# Patient Record
Sex: Female | Born: 1995 | Hispanic: Yes | Smoking: Never smoker
Health system: Southern US, Community
[De-identification: ages and names within clinical notes are randomized; demographics above are authoritative.]

## PROBLEM LIST (undated history)

## (undated) ENCOUNTER — Ambulatory Visit: Admission: EM | Payer: BC Managed Care – PPO

## (undated) DIAGNOSIS — O139 Gestational [pregnancy-induced] hypertension without significant proteinuria, unspecified trimester: Secondary | ICD-10-CM

## (undated) DIAGNOSIS — F419 Anxiety disorder, unspecified: Secondary | ICD-10-CM

## (undated) HISTORY — DX: Anxiety disorder, unspecified: F41.9

## (undated) HISTORY — DX: Gestational (pregnancy-induced) hypertension without significant proteinuria, unspecified trimester: O13.9

## (undated) HISTORY — PX: NO PAST SURGERIES: SHX2092

---
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
## 2014-06-24 IMAGING — US US OB DETAIL+14 WK - NRPT [facility]
1 series · 14 of 28 positions shown · non-contrast
Comparison: none

[Series 1: us ob detail+14 wk - nrpt [facility] · 0.30mm/px · 14 of 93 slices shown]
[im 4/93]
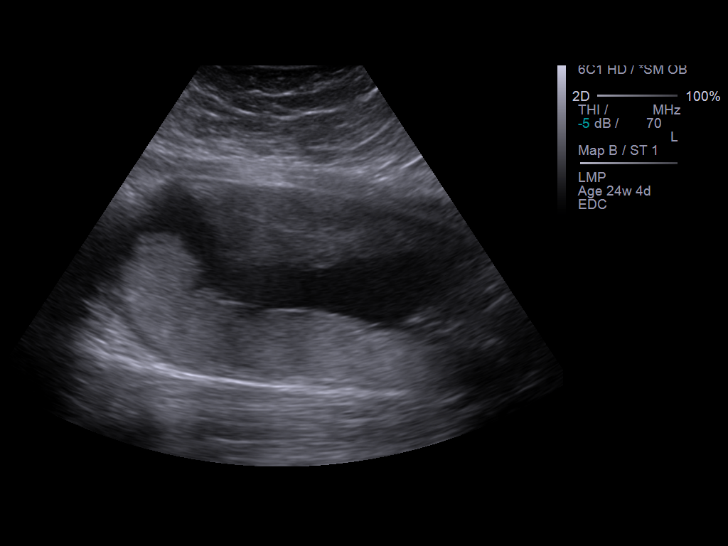
[im 11/93]
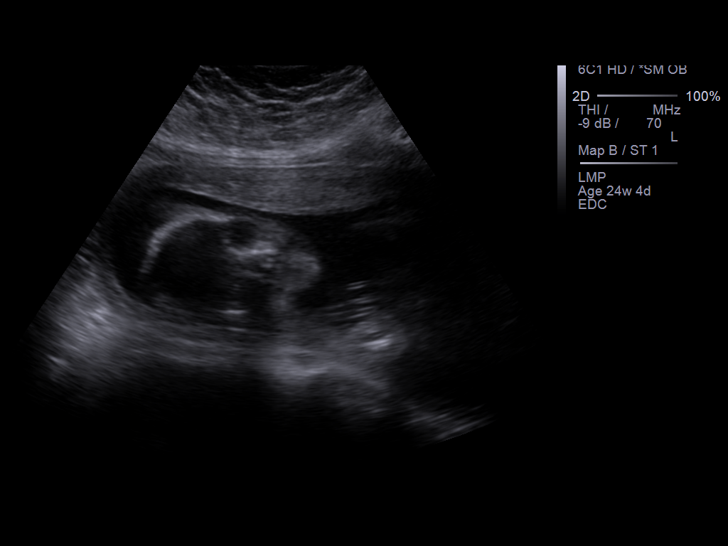
[im 18/93]
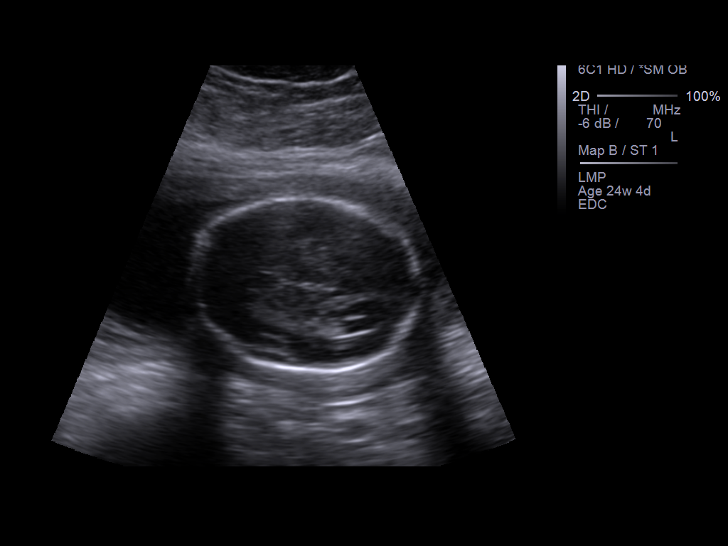
[im 24/93]
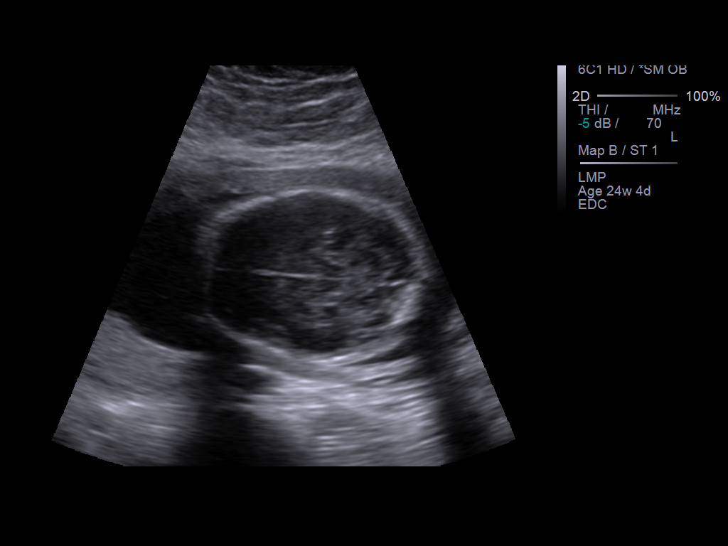
[im 31/93]
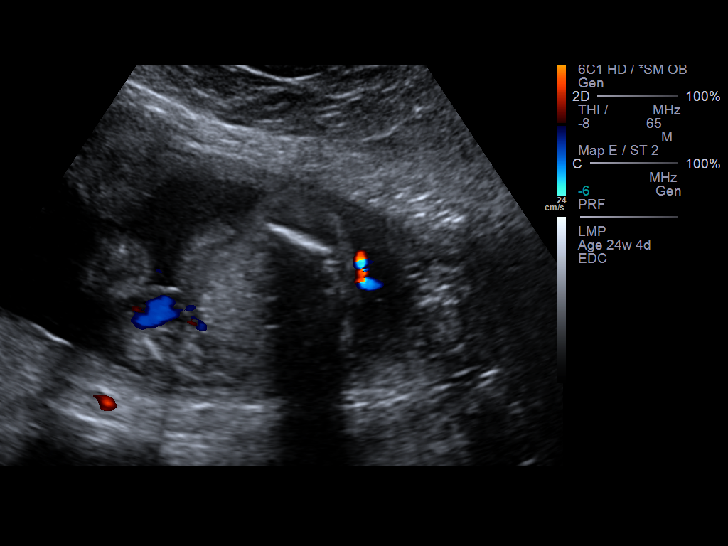
[im 38/93]
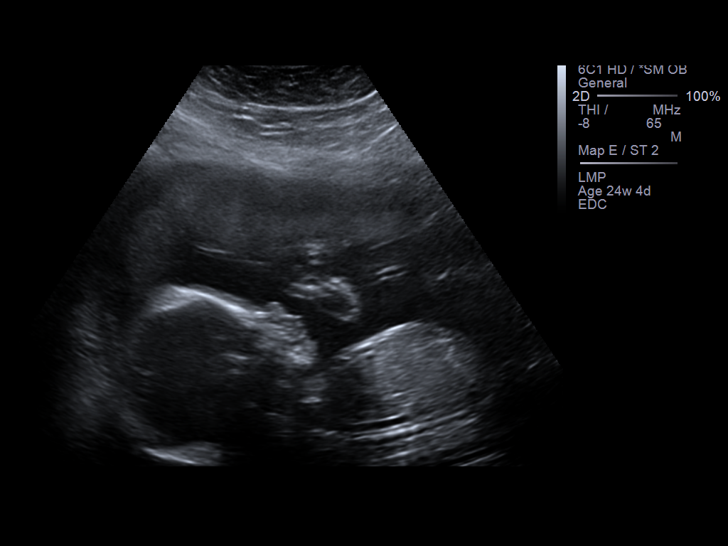
[im 45/93]
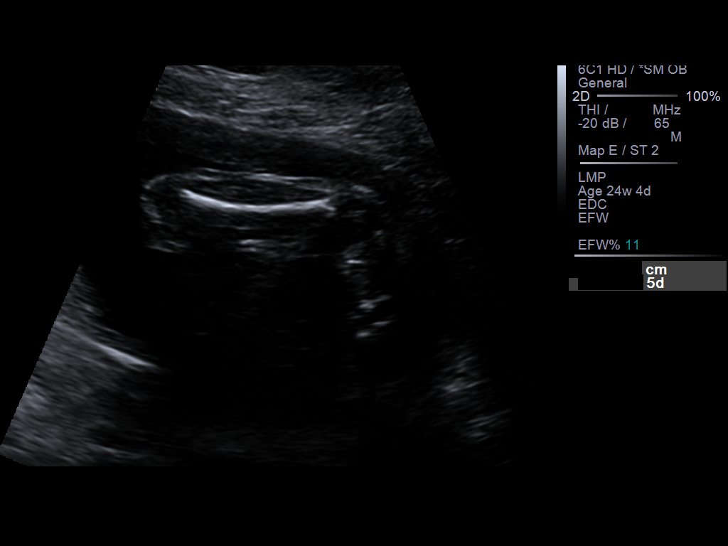
[im 52/93]
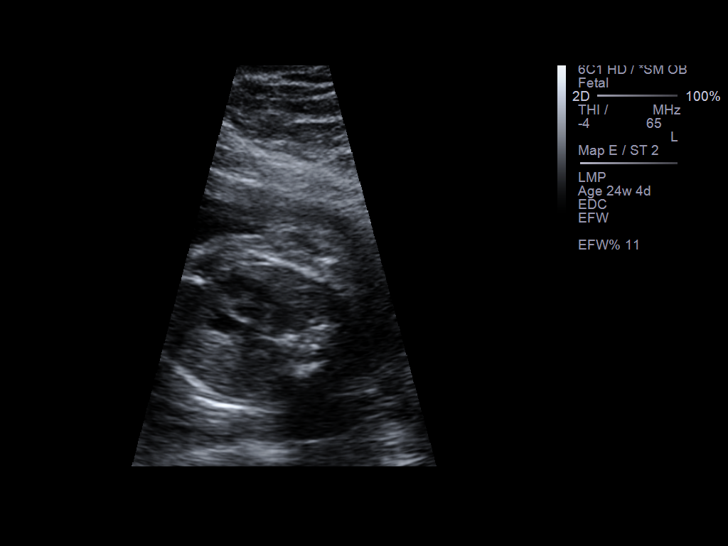
[im 58/93]
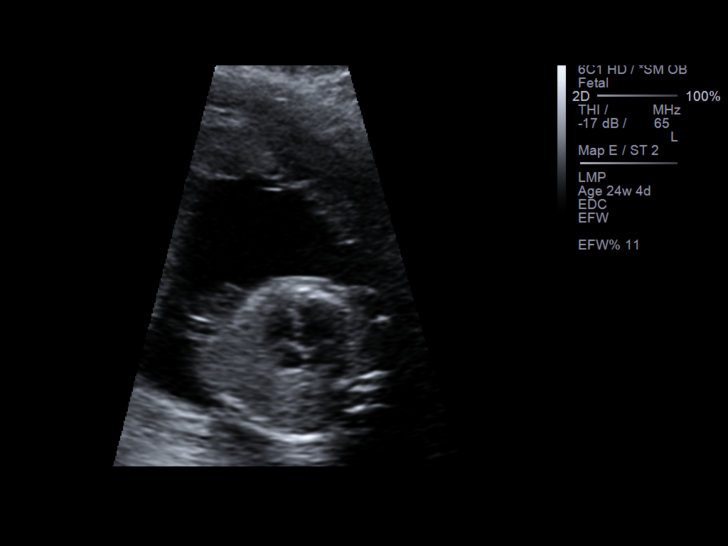
[im 65/93]
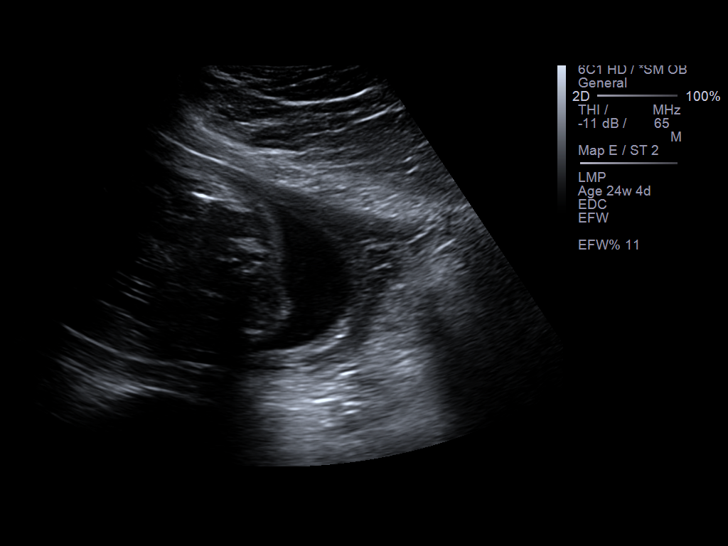
[im 72/93]
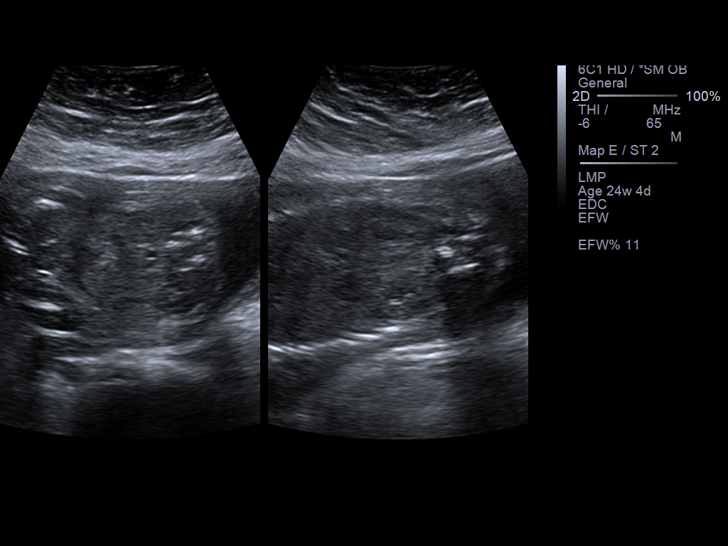
[im 79/93]
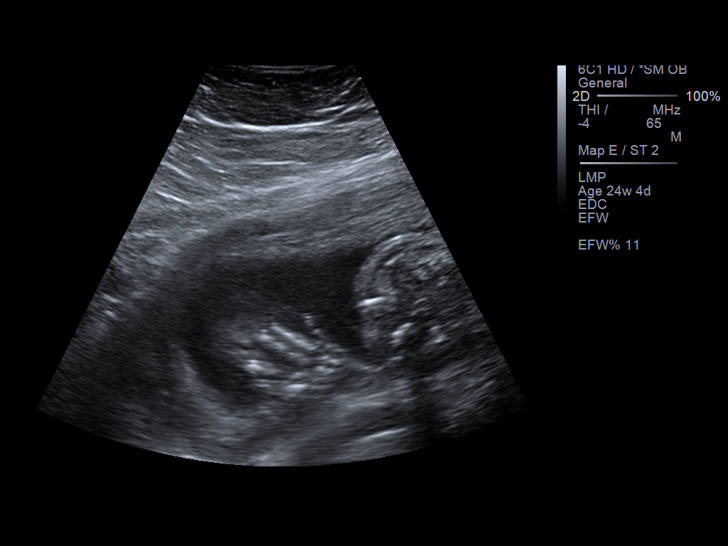
[im 86/93]
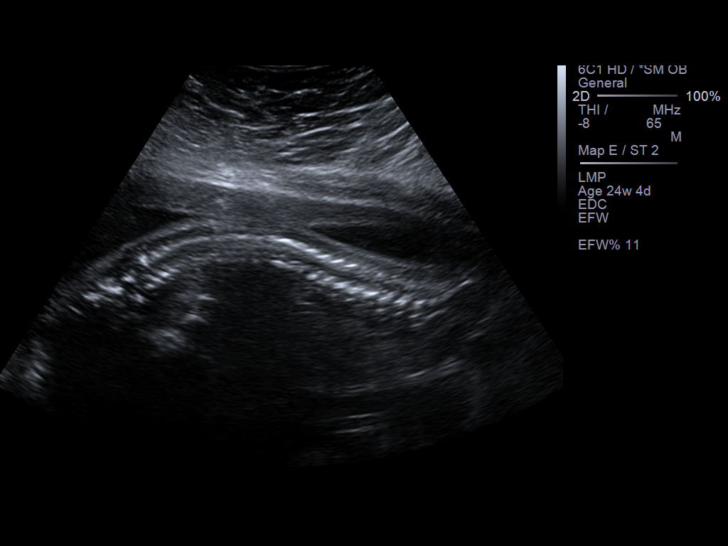
[im 93/93]
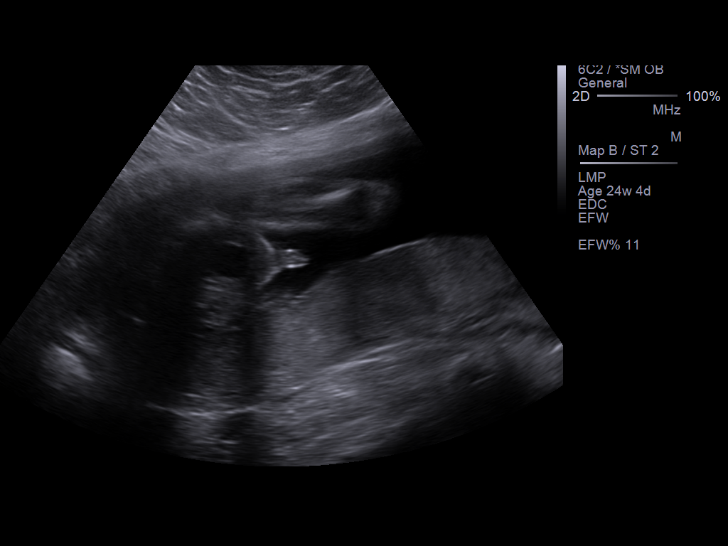

[14 of 28 positions shown; findings below may reference images not displayed]

IMAGES IMPORTED FROM THE SYNGO WORKFLOW SYSTEM
NO DICTATION FOR STUDY

## 2014-08-10 NOTE — H&P (Signed)
L&D Evaluation:  History:  HPI 19 year old G1 P0 with EDC=03/08/2013 by LMP=06/01/2012 (and a 18wk6d US -4days) sent from office at 40 1/7 weeks with oligohydraminos (AFI=3.86) and elevated BP 136/92. Baby active. Denies strong regular ctxs, LOF, headaches, visual changes, RUQ pain. Has had more swelling in the hands and feet. Noticed tinged mucus on toliet tissue today after cx exam.  [facility], and BMI=39 at start of pregnancy with a 70# weight gain. One hr GTTs 83 and 93. Growth scans q 4 weeks in the last trimester with the last EFW 7# on 11/14 (64th%). Nonstress tests have been reactive. LABS: O POS, RNI, Varicella Immune, and GBS positive. Received  last TDAP 07/27/2010 and flu vaccine 01/20/2013   Presents with oligohydraminos and elevated BP   Patient's Medical History Obesity   Patient's Surgical History none   Medications Pre Natal Vitamins   Allergies NKDA   Social History none   Family History Non-Contributory   ROS:  ROS see HPI   Exam:  Vital Signs 158/91, 146/76, 121/72    Urine Protein PRO/CR=2769 mgm   General no apparent distress   Mental Status clear   Chest clear   Heart normal sinus rhythm, no murmur/gallop/rubs   Abdomen gravid, non-tender   Estimated Fetal Weight Large for gestational age, 9#   Fetal Position vtx   Edema 1+   Reflexes 2+   Pelvic no external lesions, 1/50%/-1 to -2/mid/soft   Mebranes Intact   FHT normal rate with no decels, 130-135 with accels to 150s-Cat 1   Ucx irregular, mild   Skin dry   Other PIH labs WNL except for proteinuria.   Impression:  Impression IUP at 40.1 wks with oligo and preeclampsia without severe features.   Plan:  Plan EFM/NST, monitor contractions and for cervical change, monitor BP, antibiotics for GBBS prophylaxis, Discussed risks of continued observation and testing vs risks of induction. Aware of risks of hyperstimulation, FITL, C-section and failed  induction. as well as probably needing serial IOL with unripe cervix.  Patient verbalizes understanding and wishes to proceed with IOL. Cervidil inserted vaginally.   Electronic Signatures: Trinna BalloonGutierrez, Jaun Galluzzo L (CNM)  (Signed 08-Dec-14 16:32)  Authored: L&D Evaluation   Last Updated: 08-Dec-14 16:32 by Trinna BalloonGutierrez, Charles Niese L (CNM)

## 2019-05-04 ENCOUNTER — Encounter: Payer: Self-pay | Admitting: Emergency Medicine

## 2019-05-04 ENCOUNTER — Emergency Department
Admission: EM | Admit: 2019-05-04 | Discharge: 2019-05-04 | Disposition: A | Discharge status: Home / Self Care | Payer: 59 | Attending: Emergency Medicine | Admitting: Emergency Medicine

## 2019-05-04 ENCOUNTER — Other Ambulatory Visit: Payer: Self-pay

## 2019-05-04 ENCOUNTER — Emergency Department: Payer: 59

## 2019-05-04 DIAGNOSIS — Z20822 Contact with and (suspected) exposure to covid-19: Secondary | ICD-10-CM | POA: Insufficient documentation

## 2019-05-04 DIAGNOSIS — R197 Diarrhea, unspecified: Secondary | ICD-10-CM | POA: Insufficient documentation

## 2019-05-04 DIAGNOSIS — R112 Nausea with vomiting, unspecified: Secondary | ICD-10-CM | POA: Insufficient documentation

## 2019-05-04 DIAGNOSIS — R05 Cough: Secondary | ICD-10-CM | POA: Diagnosis not present

## 2019-05-04 DIAGNOSIS — R509 Fever, unspecified: Secondary | ICD-10-CM | POA: Insufficient documentation

## 2019-05-04 DIAGNOSIS — R0602 Shortness of breath: Secondary | ICD-10-CM | POA: Diagnosis present

## 2019-05-04 LAB — CBC
HCT: 43.1 % (ref 36.0–46.0)
Hemoglobin: 13.3 g/dL (ref 12.0–15.0)
MCH: 24.7 pg — ABNORMAL LOW (ref 26.0–34.0)
MCHC: 30.9 g/dL (ref 30.0–36.0)
MCV: 80 fL (ref 80.0–100.0)
Platelets: 286 10*3/uL (ref 150–400)
RBC: 5.39 MIL/uL — ABNORMAL HIGH (ref 3.87–5.11)
RDW: 14.6 % (ref 11.5–15.5)
WBC: 9.8 10*3/uL (ref 4.0–10.5)
nRBC: 0 % (ref 0.0–0.2)

## 2019-05-04 LAB — BASIC METABOLIC PANEL
Anion gap: 10 (ref 5–15)
BUN: 6 mg/dL (ref 6–20)
CO2: 27 mmol/L (ref 22–32)
Calcium: 9.4 mg/dL (ref 8.9–10.3)
Chloride: 103 mmol/L (ref 98–111)
Creatinine, Ser: 0.55 mg/dL (ref 0.44–1.00)
GFR calc Af Amer: >60 mL/min (ref >60)
GFR calc non Af Amer: >60 mL/min (ref >60)
Glucose, Bld: 131 mg/dL — ABNORMAL HIGH (ref 70–99)
Potassium: 3.9 mmol/L (ref 3.5–5.1)
Sodium: 140 mmol/L (ref 135–145)

## 2019-05-04 LAB — TROPONIN I (HIGH SENSITIVITY): Troponin I (High Sensitivity): 3 ng/L (ref <18)

## 2019-05-04 LAB — POCT PREGNANCY, URINE: Preg Test, Ur: NEGATIVE

## 2019-05-04 MED ORDER — ALBUTEROL SULFATE HFA 108 (90 BASE) MCG/ACT IN AERS: 2.0000 | INHALATION_SPRAY | Freq: Four times a day (QID) | RESPIRATORY_TRACT | 0 refills | Status: DC | PRN

## 2019-05-04 MED ORDER — ONDANSETRON 4 MG PO TBDP: 4.0000 mg | ORAL_TABLET | Freq: Three times a day (TID) | ORAL | 0 refills | Status: DC | PRN

## 2019-05-04 NOTE — ED Notes (Addendum)
Patient reports that out of nowhere on Friday morning her body felt hot and she felt short of breath, and she felt like she was going to pass out.  She felt better Friday afternoon, but the symptoms returned tonight around 1130 pm.  Covid test at urgent care on Friday was negative.  Pt does not appear to be in distress, SpO2 100 on room air.  Denies pain.

## 2019-05-04 NOTE — ED Triage Notes (Signed)
Patient with complaint of shortness of breath, hot flashes and vomiting that started on Friday. Patient states that she was seen at urgent care and tested for covid but that it was negative.

## 2019-05-04 NOTE — ED Provider Notes (Signed)
[facility] Emergency Department Provider Note   ____________________________________________   First MD Initiated Contact with Patient 05/04/19 0205     (approximate)  I have reviewed the triage vital signs and the nursing notes.   HISTORY  Chief Complaint Shortness of Breath and Emesis    HPI Ashley Austin is a 24 y.o. female with no significant past medical history who presents to the ED complaining of vomiting and shortness of breath.  Patient reports that she has been dealing with subjective fevers and chills for the past 3 days.  She describes occasional shortness of breath and was dealing with a cough yesterday, however she states she is not currently feeling short of breath and her cough is improved.  She has been feeling nauseous with a couple episodes of nonbilious and nonbloody vomiting, also endorses diarrhea.  She has not had any chest pain.  She denies any sick contacts, was tested for Covid earlier this week at an urgent care, where results were negative.        History reviewed. No pertinent past medical history.  There are no problems to display for this patient.   History reviewed. No pertinent surgical history.  Prior to Admission medications   Medication Sig Start Date End Date Taking? Authorizing Provider  albuterol (VENTOLIN HFA) 108 (90 Base) MCG/ACT inhaler Inhale 2 puffs into the lungs every 6 (six) hours as needed for wheezing or shortness of breath. 05/04/19   Chesley Noon, MD  ondansetron (ZOFRAN ODT) 4 MG disintegrating tablet Take 1 tablet (4 mg total) by mouth every 8 (eight) hours as needed for nausea or vomiting. 05/04/19   Chesley Noon, MD    Allergies Patient has no known allergies.  No family history on file.  Social History Social History   Tobacco Use  . Smoking status: Never Smoker  . Smokeless tobacco: Never Used  Substance Use Topics  . Alcohol use: Not on file  . Drug use: Not on file    Review  of Systems  Constitutional: Positive for fever/chills Eyes: No visual changes. ENT: No sore throat. Cardiovascular: Denies chest pain. Respiratory: Positive for cough and shortness of breath. Gastrointestinal: No abdominal pain.  Positive for nausea and vomiting.  Positive for diarrhea.  No constipation. Genitourinary: Negative for dysuria. Musculoskeletal: Negative for back pain. Skin: Negative for rash. Neurological: Negative for headaches, focal weakness or numbness.  ____________________________________________   PHYSICAL EXAM:  VITAL SIGNS: ED Triage Vitals  Enc Vitals Group     BP 05/04/19 0117 (!) 157/83     Pulse Rate 05/04/19 0117 97     Resp 05/04/19 0117 16     Temp 05/04/19 0117 98.3 F (36.8 C)     Temp Source 05/04/19 0117 Oral     SpO2 05/04/19 0117 100 %     Weight 05/04/19 0111 (!) 318 lb (144.2 kg)     Height 05/04/19 0111 5\' 1"  (1.549 m)     Head Circumference --      Peak Flow --      Pain Score 05/04/19 0111 0     Pain Loc --      Pain Edu? --      Excl. in [place]? --     Constitutional: Alert and oriented. Eyes: Conjunctivae are normal. Head: Atraumatic. Nose: No congestion/rhinnorhea. Mouth/Throat: Mucous membranes are moist. Neck: Normal ROM Cardiovascular: Normal rate, regular rhythm. Grossly normal heart sounds. Respiratory: Normal respiratory effort.  No retractions. Lungs CTAB. Gastrointestinal: Soft and  nontender. No distention. Genitourinary: deferred Musculoskeletal: No lower extremity tenderness nor edema. Neurologic:  Normal speech and language. No gross focal neurologic deficits are appreciated. Skin:  Skin is warm, dry and intact. No rash noted. Psychiatric: Mood and affect are normal. Speech and behavior are normal.  ____________________________________________   LABS (all labs ordered are listed, but only abnormal results are displayed)  Labs Reviewed  BASIC METABOLIC PANEL - Abnormal; Notable for the following components:       Result Value   Glucose, Bld 131 (*)    All other components within normal limits  CBC - Abnormal; Notable for the following components:   RBC 5.39 (*)    MCH 24.7 (*)    All other components within normal limits  NOVEL CORONAVIRUS, NAA (HOSP ORDER, SEND-OUT TO REF LAB; TAT 18-24 HRS)  POC URINE PREG, ED  POCT PREGNANCY, URINE  TROPONIN I (HIGH SENSITIVITY)   ____________________________________________  EKG  ED ECG REPORT I, Blake Divine, the attending physician, personally viewed and interpreted this ECG.   Date: 05/04/2019  EKG Time: 1:24  Rate: 93  Rhythm: normal sinus rhythm  Axis: Normal  Intervals:none  ST&T Change: None   PROCEDURES  Procedure(s) performed (including Critical Care):  Procedures   ____________________________________________   INITIAL IMPRESSION / ASSESSMENT AND PLAN / ED COURSE       24 year old female presents to the ED complaining of subjective fevers and chills, shortness of breath, cough, nausea, and vomiting over the past 3 days.  She does state that her cough is improved and she is not feeling short of breath currently.  She is not in any respiratory distress and is maintaining O2 sats on room air.  Chest x-ray is negative for acute process and EKG shows no evidence of arrhythmia or ischemia.  Symptoms appear most consistent with COVID-19 and given she has only had a rapid test to thus far, we will perform PCR testing.  Lab work is reassuring, pregnancy testing negative.  Doubt PE as she is PERC negative.  She is appropriate for discharge home with COVID-19 results pending, will prescribe Zofran and albuterol for symptomatic management.  Counseled patient to quarantine and to return to the ED for new worsening symptoms, patient agrees with plan.      ____________________________________________   FINAL CLINICAL IMPRESSION(S) / ED DIAGNOSES  Final diagnoses:  Suspected COVID-19 virus infection  Nausea vomiting and diarrhea   Shortness of breath     ED Discharge Orders         Ordered    ondansetron (ZOFRAN ODT) 4 MG disintegrating tablet  Every 8 hours PRN     05/04/19 0255    albuterol (VENTOLIN HFA) 108 (90 Base) MCG/ACT inhaler  Every 6 hours PRN    Note to Pharmacy: Please supply with spacer   05/04/19 0255           Note:  This document was prepared using Dragon voice recognition software and may include unintentional dictation errors.   Blake Divine, MD 05/04/19 [phone]

## 2019-05-05 LAB — NOVEL CORONAVIRUS, NAA (HOSP ORDER, SEND-OUT TO REF LAB; TAT 18-24 HRS): SARS-CoV-2, NAA: NOT DETECTED

## 2019-05-06 ENCOUNTER — Encounter: Payer: Self-pay | Admitting: Emergency Medicine

## 2019-05-06 ENCOUNTER — Other Ambulatory Visit: Payer: Self-pay

## 2019-05-06 DIAGNOSIS — R42 Dizziness and giddiness: Secondary | ICD-10-CM | POA: Diagnosis present

## 2019-05-06 DIAGNOSIS — R110 Nausea: Secondary | ICD-10-CM | POA: Diagnosis not present

## 2019-05-06 LAB — CBC WITH DIFFERENTIAL/PLATELET
Abs Immature Granulocytes: 0.04 10*3/uL (ref 0.00–0.07)
Basophils Absolute: 0 10*3/uL (ref 0.0–0.1)
Basophils Relative: 0 %
Eosinophils Absolute: 0.1 10*3/uL (ref 0.0–0.5)
Eosinophils Relative: 1 %
HCT: 43.7 % (ref 36.0–46.0)
Hemoglobin: 13.8 g/dL (ref 12.0–15.0)
Immature Granulocytes: 0 %
Lymphocytes Relative: 14 %
Lymphs Abs: 1.7 10*3/uL (ref 0.7–4.0)
MCH: 24.8 pg — ABNORMAL LOW (ref 26.0–34.0)
MCHC: 31.6 g/dL (ref 30.0–36.0)
MCV: 78.6 fL — ABNORMAL LOW (ref 80.0–100.0)
Monocytes Absolute: 0.6 10*3/uL (ref 0.1–1.0)
Monocytes Relative: 5 %
Neutro Abs: 9.2 10*3/uL — ABNORMAL HIGH (ref 1.7–7.7)
Neutrophils Relative %: 80 %
Platelets: 307 10*3/uL (ref 150–400)
RBC: 5.56 MIL/uL — ABNORMAL HIGH (ref 3.87–5.11)
RDW: 14.3 % (ref 11.5–15.5)
WBC: 11.6 10*3/uL — ABNORMAL HIGH (ref 4.0–10.5)
nRBC: 0 % (ref 0.0–0.2)

## 2019-05-06 LAB — URINALYSIS, COMPLETE (UACMP) WITH MICROSCOPIC
Bacteria, UA: NONE SEEN
Bilirubin Urine: NEGATIVE
Glucose, UA: NEGATIVE mg/dL
Hgb urine dipstick: NEGATIVE
Ketones, ur: NEGATIVE mg/dL
Leukocytes,Ua: NEGATIVE
Nitrite: NEGATIVE
Protein, ur: NEGATIVE mg/dL
Specific Gravity, Urine: 1.003 — ABNORMAL LOW (ref 1.005–1.030)
pH: 6 (ref 5.0–8.0)

## 2019-05-06 LAB — POCT PREGNANCY, URINE: Preg Test, Ur: NEGATIVE

## 2019-05-06 NOTE — ED Triage Notes (Signed)
Patient ambulatory to triage with steady gait, without difficulty or distress noted, mask in place; pt reports here Sunday but not feeling better; c/o persistent dizziness and nausea; st COVID -

## 2019-05-07 ENCOUNTER — Emergency Department
Admission: EM | Admit: 2019-05-07 | Discharge: 2019-05-07 | Disposition: A | Discharge status: Home / Self Care | Payer: 59 | Attending: Emergency Medicine | Admitting: Emergency Medicine

## 2019-05-07 DIAGNOSIS — R42 Dizziness and giddiness: Secondary | ICD-10-CM

## 2019-05-07 DIAGNOSIS — R110 Nausea: Secondary | ICD-10-CM

## 2019-05-07 LAB — COMPREHENSIVE METABOLIC PANEL
ALT: 38 U/L (ref 0–44)
AST: 25 U/L (ref 15–41)
Albumin: 4.2 g/dL (ref 3.5–5.0)
Alkaline Phosphatase: 67 U/L (ref 38–126)
Anion gap: 9 (ref 5–15)
BUN: 10 mg/dL (ref 6–20)
CO2: 27 mmol/L (ref 22–32)
Calcium: 9.5 mg/dL (ref 8.9–10.3)
Chloride: 102 mmol/L (ref 98–111)
Creatinine, Ser: 0.74 mg/dL (ref 0.44–1.00)
GFR calc Af Amer: >60 mL/min (ref >60)
GFR calc non Af Amer: >60 mL/min (ref >60)
Glucose, Bld: 112 mg/dL — ABNORMAL HIGH (ref 70–99)
Potassium: 3.7 mmol/L (ref 3.5–5.1)
Sodium: 138 mmol/L (ref 135–145)
Total Bilirubin: 0.6 mg/dL (ref 0.3–1.2)
Total Protein: 8 g/dL (ref 6.5–8.1)

## 2019-05-07 LAB — TROPONIN I (HIGH SENSITIVITY): Troponin I (High Sensitivity): <2 ng/L (ref <18)

## 2019-05-07 MED ORDER — ONDANSETRON 4 MG PO TBDP
4.0000 mg | ORAL_TABLET | Freq: Once | ORAL | Status: AC
  Administered 2019-05-07: 4 mg via ORAL
  Filled 2019-05-07: qty 1

## 2019-05-07 NOTE — ED Provider Notes (Signed)
[facility] Emergency Department Provider Note  ____________________________________________  Time seen: Approximately 12:55 AM  I have reviewed the triage vital signs and the nursing notes.   HISTORY  Chief Complaint Dizziness   HPI Ashley Austin is a 24 y.o. female no significant past medical history who presents for evaluation of nausea and dizziness.  Patient reports that 4 days ago she started feeling like she had a cold with congestion, subjective fever, chills, vomiting.  She was seen in the emergency department and had a negative Covid test.  She reports that her symptoms were improving.  She is no longer having any vomiting, her cough and congestion have resolved.  Today she went back to work and while at work she started feeling dizzy and was shivering, she could not get herself to feel warm.  She felt like she was going to pass out which prompted the visit to the emergency room.  No chest pain or shortness of breath, no diarrhea.  Patient does report decreased appetite and has been eating and drinking less than normal.  No abdominal pain.  No fever.  She feels back to normal at this time.   PMH None - reviewed  Prior to Admission medications   Medication Sig Start Date End Date Taking? Authorizing Provider  albuterol (VENTOLIN HFA) 108 (90 Base) MCG/ACT inhaler Inhale 2 puffs into the lungs every 6 (six) hours as needed for wheezing or shortness of breath. 05/04/19   Blake Divine, MD  ondansetron (ZOFRAN ODT) 4 MG disintegrating tablet Take 1 tablet (4 mg total) by mouth every 8 (eight) hours as needed for nausea or vomiting. 05/04/19   Blake Divine, MD    Allergies Patient has no known allergies.  No family history on file.  Social History Social History   Tobacco Use  . Smoking status: Never Smoker  . Smokeless tobacco: Never Used  Substance Use Topics  . Alcohol use: Not on file  . Drug use: Not on file    Review of  Systems  Constitutional: Negative for fever. + Lightheadedness Eyes: Negative for visual changes. ENT: Negative for sore throat. Neck: No neck pain  Cardiovascular: Negative for chest pain. Respiratory: Negative for shortness of breath. Gastrointestinal: Negative for abdominal pain, vomiting or diarrhea. + nausea Genitourinary: Negative for dysuria. Musculoskeletal: Negative for back pain. Skin: Negative for rash. Neurological: Negative for headaches, weakness or numbness. Psych: No SI or HI  ____________________________________________   PHYSICAL EXAM:  VITAL SIGNS: ED Triage Vitals  Enc Vitals Group     BP 05/06/19 2331 122/83     Pulse Rate 05/06/19 2331 92     Resp 05/06/19 2331 18     Temp 05/06/19 2331 98.8 F (37.1 C)     Temp Source 05/06/19 2331 Oral     SpO2 05/06/19 2331 100 %     Weight 05/06/19 2327 (!) 318 lb (144.2 kg)     Height 05/06/19 2327 5\' 1"  (1.549 m)     Head Circumference --      Peak Flow --      Pain Score 05/06/19 2326 0     Pain Loc --      Pain Edu? --      Excl. in [place]? --     Constitutional: Alert and oriented. Well appearing and in no apparent distress. HEENT:      Head: Normocephalic and atraumatic.         Eyes: Conjunctivae are normal. Sclera is non-icteric.  Mouth/Throat: Mucous membranes are moist.       Neck: Supple with no signs of meningismus. Cardiovascular: Regular rate and rhythm. No murmurs, gallops, or rubs. 2+ symmetrical distal pulses are present in all extremities. No JVD. Respiratory: Normal respiratory effort. Lungs are clear to auscultation bilaterally. No wheezes, crackles, or rhonchi.  Gastrointestinal: Soft, non tender, and non distended with positive bowel sounds. No rebound or guarding. Musculoskeletal: Nontender with normal range of motion in all extremities. No edema, cyanosis, or erythema of extremities. Neurologic: Normal speech and language. Face is symmetric. Moving all extremities. No gross focal  neurologic deficits are appreciated. Skin: Skin is warm, dry and intact. No rash noted. Psychiatric: Mood and affect are normal. Speech and behavior are normal.  ____________________________________________   LABS (all labs ordered are listed, but only abnormal results are displayed)  Labs Reviewed  CBC WITH DIFFERENTIAL/PLATELET - Abnormal; Notable for the following components:      Result Value   WBC 11.6 (*)    RBC 5.56 (*)    MCV 78.6 (*)    MCH 24.8 (*)    Neutro Abs 9.2 (*)    All other components within normal limits  COMPREHENSIVE METABOLIC PANEL - Abnormal; Notable for the following components:   Glucose, Bld 112 (*)    All other components within normal limits  URINALYSIS, COMPLETE (UACMP) WITH MICROSCOPIC - Abnormal; Notable for the following components:   Color, Urine STRAW (*)    APPearance CLEAR (*)    Specific Gravity, Urine 1.003 (*)    All other components within normal limits  POCT PREGNANCY, URINE  TROPONIN I (HIGH SENSITIVITY)   ____________________________________________  EKG  ED ECG REPORT I, Nita Sickle, the attending physician, personally viewed and interpreted this ECG.  Normal sinus rhythm, rate of 87, normal intervals, normal axis, no ST elevations or depressions.  Unchanged from prior. ____________________________________________  RADIOLOGY  none  ____________________________________________   PROCEDURES  Procedure(s) performed: None Procedures Critical Care performed:  None ____________________________________________   INITIAL IMPRESSION / ASSESSMENT AND PLAN / ED COURSE   24 y.o. female no significant past medical history who presents for evaluation of nausea, decreased appetite, and dizziness.  Patient is extremely well-appearing in no distress, she has normal vital signs, negative orthostatic vital signs, looks well-hydrated with normal work of breathing and normal sats, abdomen is soft with no tenderness.  Labs show no  signs of dehydration, anemia, electrolyte abnormalities, pregnancy, or UTI.  EKG and troponin showed no signs of ischemia or dysrhythmias.  Mildly elevated white count of 11.6 most likely due to a viral syndrome since patient has had a couple of days of viral symptoms. Seems to be improving per patient report.  Covid test done 2 days ago and negative.  Will provide with a note for work for another day or 2, Zofran for nausea, increase oral hydration follow-up with PCP.  Discussed my standard return precautions for chest pain, shortness of breath, syncope.       As part of my medical decision making, I reviewed the following data within the electronic MEDICAL RECORD NUMBER Nursing notes reviewed and incorporated, Labs reviewed , EKG interpreted , Old EKG reviewed, Old chart reviewed, Notes from prior ED visits and [place] Controlled Substance Database   Please note:  Patient was evaluated in Emergency Department today for the symptoms described in the history of present illness. Patient was evaluated in the context of the global COVID-19 pandemic, which necessitated consideration that the patient might be at  risk for infection with the SARS-CoV-2 virus that causes COVID-19. Institutional protocols and algorithms that pertain to the evaluation of patients at risk for COVID-19 are in a state of rapid change based on information released by regulatory bodies including the CDC and federal and state organizations. These policies and algorithms were followed during the patient's care in the ED.  Some ED evaluations and interventions may be delayed as a result of limited staffing during the pandemic.   ____________________________________________   FINAL CLINICAL IMPRESSION(S) / ED DIAGNOSES   Final diagnoses:  Lightheadedness  Nausea      NEW MEDICATIONS STARTED DURING THIS VISIT:  ED Discharge Orders    None       Note:  This document was prepared using Dragon voice recognition software and may  include unintentional dictation errors.    Don Perking, [place], MD 05/07/19 0100

## 2019-06-14 NOTE — Patient Instructions (Signed)
I value your feedback and entrusting us with your care. If you get a [facility] patient survey, I would appreciate you taking the time to let us know about your experience today. Thank you!  As of March 12, 2019, your lab results will be released to your MyChart immediately, before I even have a chance to see them. Please give me time to review them and contact you if there are any abnormalities. Thank you for your patience.  

## 2019-06-14 NOTE — Progress Notes (Signed)
PCP:  Patient, No Pcp Per   Chief Complaint  Patient presents with  . Gynecologic Exam    flu shot     HPI:      [name] is a 24 y.o. No obstetric history on file. who LMP was Patient's last menstrual period was 06/02/2019 (approximate)., presents today for her NP > 70yrs annual examination.  Her menses are regular every 28-30 days, lasting 7 days.  Dysmenorrhea none. She does not have intermenstrual bleeding.  Sex activity: single partner, contraception - condoms, declines other BC.  Last Pap: not recent; no hx of abn Hx of STDs: none  There is no FH of breast cancer. There is no FH of ovarian cancer. The patient does not do self-breast exams.  Tobacco use: The patient denies current or previous tobacco use. Alcohol use: none No drug use.  Exercise: moderately active  She does get adequate calcium and Vitamin D in her diet.  Has had 2 sinus infections since Feb. No hx of them in past. On augmentin twice. Had vision changes at one point, but now ears hurt and head feels full. Also with upper body aches in bilat arms, shoulders, chest, and neck. Sx since on abx.  Taking sudafed. Went to Urgent care.   Past Medical History:  Diagnosis Date  . Gestational hypertension     History reviewed. No pertinent surgical history.  Family History  Problem Relation Age of Onset  . Breast cancer Neg Hx   . Ovarian cancer Neg Hx     Social History   Socioeconomic History  . Marital status: Single    Spouse name: Not on file  . Number of children: Not on file  . Years of education: Not on file  . Highest education level: Not on file  Occupational History  . Not on file  Tobacco Use  . Smoking status: Never Smoker  . Smokeless tobacco: Never Used  Substance and Sexual Activity  . Alcohol use: Not Currently  . Drug use: Never  . Sexual activity: Yes    Birth control/protection: None  Other Topics Concern  . Not on file  Social History Narrative  . Not on file    Social Determinants of Health   Financial Resource Strain:   . Difficulty of Paying Living Expenses:   Food Insecurity:   . Worried About Charity fundraiser in the Last Year:   . Arboriculturist in the Last Year:   Transportation Needs:   . Film/video editor (Medical):   Marland Kitchen Lack of Transportation (Non-Medical):   Physical Activity:   . Days of Exercise per Week:   . Minutes of Exercise per Session:   Stress:   . Feeling of Stress :   Social Connections:   . Frequency of Communication with Friends and Family:   . Frequency of Social Gatherings with Friends and Family:   . Attends Religious Services:   . Active Member of Clubs or Organizations:   . Attends Archivist Meetings:   Marland Kitchen Marital Status:   Intimate Partner Violence:   . Fear of Current or Ex-Partner:   . Emotionally Abused:   Marland Kitchen Physically Abused:   . Sexually Abused:      Current Outpatient Medications:  .  albuterol (VENTOLIN HFA) 108 (90 Base) MCG/ACT inhaler, Inhale 2 puffs into the lungs every 6 (six) hours as needed for wheezing or shortness of breath., Disp: 8 g, Rfl: 0 .  amoxicillin-clavulanate (AUGMENTIN)  875-125 MG tablet, Take 1 tablet by mouth 2 (two) times daily., Disp: , Rfl:      ROS:  Review of Systems  Constitutional: Positive for fatigue. Negative for fever and unexpected weight change.  HENT: Positive for ear pain, rhinorrhea and sore throat.   Respiratory: Negative for cough, shortness of breath and wheezing.   Cardiovascular: Negative for chest pain, palpitations and leg swelling.  Gastrointestinal: Negative for blood in stool, constipation, diarrhea, nausea and vomiting.  Endocrine: Negative for cold intolerance, heat intolerance and polyuria.  Genitourinary: Negative for dyspareunia, dysuria, flank pain, frequency, genital sores, hematuria, menstrual problem, pelvic pain, urgency, vaginal bleeding, vaginal discharge and vaginal pain.  Musculoskeletal: Positive for  myalgias. Negative for back pain and joint swelling.  Skin: Negative for rash.  Neurological: Negative for dizziness, syncope, light-headedness, numbness and headaches.  Hematological: Negative for adenopathy.  Psychiatric/Behavioral: Negative for agitation, confusion, sleep disturbance and suicidal ideas. The patient is not nervous/anxious.   BREAST: No symptoms   Objective: BP 130/90   Ht 5\' 1"  (1.549 m)   Wt (!) 313 lb (142 kg)   LMP 06/02/2019 (Approximate)   BMI 59.14 kg/m    Physical Exam Constitutional:      Appearance: She is well-developed.  Genitourinary:     Vulva, vagina, cervix, uterus, right adnexa and left adnexa normal.     No vulval lesion or tenderness noted.     No vaginal discharge, erythema or tenderness.     No cervical polyp.     Uterus is not enlarged or tender.     No right or left adnexal mass present.     Right adnexa not tender.     Left adnexa not tender.  HENT:     Nose:     Right Sinus: No maxillary sinus tenderness or frontal sinus tenderness.     Left Sinus: No maxillary sinus tenderness or frontal sinus tenderness.  Neck:     Thyroid: No thyromegaly.  Cardiovascular:     Rate and Rhythm: Normal rate and regular rhythm.     Heart sounds: Normal heart sounds. No murmur.  Pulmonary:     Effort: Pulmonary effort is normal.     Breath sounds: Normal breath sounds.  Chest:     Breasts:        Right: No mass, nipple discharge, skin change or tenderness.        Left: No mass, nipple discharge, skin change or tenderness.  Abdominal:     Palpations: Abdomen is soft.     Tenderness: There is no abdominal tenderness. There is no guarding.  Musculoskeletal:        General: Normal range of motion.     Cervical back: Normal range of motion.  Neurological:     General: No focal deficit present.     Mental Status: She is alert and oriented to person, place, and time.     Cranial Nerves: No cranial nerve deficit.  Skin:    General: Skin is warm  and dry.  Psychiatric:        Mood and Affect: Mood normal.        Behavior: Behavior normal.        Thought Content: Thought content normal.        Judgment: Judgment normal.  Vitals reviewed.     Assessment/Plan: Encounter for annual routine gynecological examination  Cervical cancer screening - Plan: Cytology - PAP  Screening for STD (sexually transmitted disease) - Plan: Cytology - PAP  Myalgia--upper body. Complete abx and f/u with PCP if sx persist.  Needs flu shot - Plan: Flu Vaccine QUAD 36+ mos IM (Fluarix, Quad PF)            GYN counsel adequate intake of calcium and vitamin D, diet and exercise     F/U  Return in about 1 year (around 06/15/2020).  Lesta Limbert B. Devesh Monforte, PA-C 06/16/2019 8:45 AM

## 2019-06-16 ENCOUNTER — Ambulatory Visit (INDEPENDENT_AMBULATORY_CARE_PROVIDER_SITE_OTHER): Payer: 59 | Admitting: Obstetrics and Gynecology

## 2019-06-16 ENCOUNTER — Other Ambulatory Visit (HOSPITAL_COMMUNITY)
Admission: RE | Admit: 2019-06-16 | Discharge: 2019-06-16 | Disposition: A | Discharge status: Home / Self Care | Payer: 59 | Source: Ambulatory Visit | Attending: Obstetrics and Gynecology | Admitting: Obstetrics and Gynecology

## 2019-06-16 ENCOUNTER — Other Ambulatory Visit: Payer: Self-pay

## 2019-06-16 ENCOUNTER — Encounter: Payer: Self-pay | Admitting: Obstetrics and Gynecology

## 2019-06-16 VITALS — BP 130/90 | Ht 61.0 in | Wt 313.0 lb

## 2019-06-16 DIAGNOSIS — Z23 Encounter for immunization: Secondary | ICD-10-CM

## 2019-06-16 DIAGNOSIS — Z124 Encounter for screening for malignant neoplasm of cervix: Secondary | ICD-10-CM | POA: Diagnosis present

## 2019-06-16 DIAGNOSIS — Z113 Encounter for screening for infections with a predominantly sexual mode of transmission: Secondary | ICD-10-CM | POA: Insufficient documentation

## 2019-06-16 DIAGNOSIS — M791 Myalgia, unspecified site: Secondary | ICD-10-CM | POA: Diagnosis not present

## 2019-06-16 DIAGNOSIS — Z01419 Encounter for gynecological examination (general) (routine) without abnormal findings: Secondary | ICD-10-CM | POA: Diagnosis not present

## 2019-06-18 LAB — CYTOLOGY - PAP
Chlamydia: NEGATIVE
Comment: NEGATIVE
Comment: NORMAL
Diagnosis: NEGATIVE
Neisseria Gonorrhea: NEGATIVE

## 2019-11-17 ENCOUNTER — Telehealth: Payer: Self-pay

## 2019-11-17 NOTE — Telephone Encounter (Signed)
Pt calling; is preg; is it okay to take antidepressant?  [phone]  LMTC to go over medications.

## 2019-11-17 NOTE — Telephone Encounter (Signed)
Pt states she is not taking the alprazolam but is on paroxetine HCL 10mg .  Per ABC; stop taking it, call provider for preg friendly med; pt states that provider adv her to call ; adv pt it will be discussed at her NOB visit but to stop it for now.

## 2019-11-17 NOTE — Telephone Encounter (Signed)
Per [facility] we would rather she not take it; if she has taken it don't take anymore.

## 2019-12-15 ENCOUNTER — Other Ambulatory Visit (HOSPITAL_COMMUNITY)
Admission: RE | Admit: 2019-12-15 | Discharge: 2019-12-15 | Disposition: A | Discharge status: Home / Self Care | Payer: 59 | Source: Ambulatory Visit | Attending: Obstetrics and Gynecology | Admitting: Obstetrics and Gynecology

## 2019-12-15 ENCOUNTER — Encounter: Payer: Self-pay | Admitting: Obstetrics and Gynecology

## 2019-12-15 ENCOUNTER — Ambulatory Visit (INDEPENDENT_AMBULATORY_CARE_PROVIDER_SITE_OTHER): Payer: 59

## 2019-12-15 ENCOUNTER — Ambulatory Visit (INDEPENDENT_AMBULATORY_CARE_PROVIDER_SITE_OTHER): Payer: 59 | Admitting: Obstetrics and Gynecology

## 2019-12-15 ENCOUNTER — Other Ambulatory Visit: Payer: Self-pay | Admitting: Obstetrics and Gynecology

## 2019-12-15 ENCOUNTER — Other Ambulatory Visit: Payer: Self-pay

## 2019-12-15 VITALS — BP 108/70 | Ht 61.0 in | Wt 267.8 lb

## 2019-12-15 DIAGNOSIS — Z3A09 9 weeks gestation of pregnancy: Secondary | ICD-10-CM | POA: Insufficient documentation

## 2019-12-15 DIAGNOSIS — O0990 Supervision of high risk pregnancy, unspecified, unspecified trimester: Secondary | ICD-10-CM | POA: Diagnosis not present

## 2019-12-15 DIAGNOSIS — O99210 Obesity complicating pregnancy, unspecified trimester: Secondary | ICD-10-CM | POA: Insufficient documentation

## 2019-12-15 LAB — POCT URINALYSIS DIPSTICK OB
Glucose, UA: NEGATIVE
POC,PROTEIN,UA: NEGATIVE

## 2019-12-15 NOTE — Progress Notes (Signed)
12/15/2019   Chief Complaint: Missed period  Transfer of Care Patient: no  History of Present Illness: Ashley Austin is a 24 y.o. G3P1001 [redacted]w[redacted]d based on Patient's last menstrual period was 10/10/2019. with an Estimated Date of Delivery: 07/16/20, with the above CC.   Her periods were: regular periods every 30 days She was using no method when she conceived.  She has Positive signs or symptoms of nausea/vomiting of pregnancy. She has Negative signs or symptoms of miscarriage or preterm labor She was taking different medications around the time she conceived/early pregnancy.- Discontinued her antidepressant Since her LMP, she has not used alcohol Since her LMP, she has not used tobacco products Since her LMP, she has not used illegal drugs.    Current or past history of domestic violence. no  Infection History:  1. Since her LMP, she has not had a viral illness.  2. She admits to close contact with children on a regular basis     3. She denies a history of chicken pox. She reports vaccination for chicken pox in the past. 4. Patient or partner has history of genital herpes  no 5. History of STI (GC, CT, HPV, syphilis, HIV)  no   6.  She does not live with someone with TB or TB exposed. 7. History of recent travel :    8. She identifies Negative Zika risk factors for her and her partner 25. There are cats in the home in the home.  She understands that while pregnant she should not change cat litter.   Genetic Screening Questions: (Includes patient, baby's father, or anyone in either family)   1. Patient's age >/= 52 at [facility]  no 2. Thalassemia ([place], [place], Mediterranean, or Asian background): MCV<80  no 3. Neural tube defect (meningomyelocele, spina bifida, anencephaly)  no 4. Congenital heart defect  no  5. Down syndrome  no 6. Tay-Sachs (Jewish, [place]  no 7. Canavan's Disease  no 8. Sickle cell disease or trait (African)  no  9. Hemophilia or other blood disorders  no   10. Muscular dystrophy  no  11. Cystic fibrosis  no  12. Huntington's Chorea  no  13. Mental retardation/autism  no 14. Other inherited genetic or chromosomal disorder  no 15. Maternal metabolic disorder (DM, PKU, etc)  no 16. Patient or FOB with a child with a birth defect not listed above no  16a. Patient or FOB with a birth defect themselves no 17. Recurrent pregnancy loss, or stillbirth  no  18. Any medications since LMP other than prenatal vitamins (include vitamins, supplements, OTC meds, drugs, alcohol)  no 19. Any other genetic/environmental exposure to discuss  no  ROS:  Review of Systems  Constitutional: Negative for chills, fever, malaise/fatigue and weight loss.  HENT: Negative for congestion, hearing loss and sinus pain.   Eyes: Negative for blurred vision and double vision.  Respiratory: Negative for cough, sputum production, shortness of breath and wheezing.   Cardiovascular: Negative for chest pain, palpitations, orthopnea and leg swelling.  Gastrointestinal: Positive for nausea and vomiting. Negative for abdominal pain, constipation and diarrhea.  Genitourinary: Negative for dysuria, flank pain, frequency, hematuria and urgency.  Musculoskeletal: Negative for back pain, falls and joint pain.  Skin: Negative for itching and rash.  Neurological: Negative for dizziness and headaches.  Psychiatric/Behavioral: Negative for depression, substance abuse and suicidal ideas. The patient is not nervous/anxious.     OBGYN History: As per HPI. OB History  Gravida Para Term Preterm AB Living  3 1 1     1   SAB TAB Ectopic Multiple Live Births          1    # Outcome Date GA Lbr Len/2nd Weight Sex Delivery Anes PTL Lv  3 Current           2 Term 03/11/13 [redacted]w[redacted]d  6 lb 4 oz (2.835 kg) M Vag-Spont     1 Gravida             Any issues with any prior pregnancies: may of had gestational hypertension, she is unsure Any prior children are healthy, doing well, without any problems  or issues: yes Last pap smear 2021 NIL History of STIs: No   Past Medical History: Past Medical History:  Diagnosis Date  . Gestational hypertension     Past Surgical History: No past surgical history on file.  Family History:  Family History  Problem Relation Age of Onset  . Breast cancer Neg Hx   . Ovarian cancer Neg Hx    She denies any female cancers, bleeding or blood clotting disorders.   Social History:  Social History   Socioeconomic History  . Marital status: Single    Spouse name: Not on file  . Number of children: Not on file  . Years of education: Not on file  . Highest education level: Not on file  Occupational History  . Not on file  Tobacco Use  . Smoking status: Never Smoker  . Smokeless tobacco: Never Used  Vaping Use  . Vaping Use: Never used  Substance and Sexual Activity  . Alcohol use: Not Currently  . Drug use: Never  . Sexual activity: Yes    Birth control/protection: None  Other Topics Concern  . Not on file  Social History Narrative  . Not on file   Social Determinants of Health   Financial Resource Strain:   . Difficulty of Paying Living Expenses: Not on file  Food Insecurity:   . Worried About 2022 in the Last Year: Not on file  . Ran Out of Food in the Last Year: Not on file  Transportation Needs:   . Lack of Transportation (Medical): Not on file  . Lack of Transportation (Non-Medical): Not on file  Physical Activity:   . Days of Exercise per Week: Not on file  . Minutes of Exercise per Session: Not on file  Stress:   . Feeling of Stress : Not on file  Social Connections:   . Frequency of Communication with Friends and Family: Not on file  . Frequency of Social Gatherings with Friends and Family: Not on file  . Attends Religious Services: Not on file  . Active Member of Clubs or Organizations: Not on file  . Attends Programme researcher, broadcasting/film/video Meetings: Not on file  . Marital Status: Not on file  Intimate Partner  Violence:   . Fear of Current or Ex-Partner: Not on file  . Emotionally Abused: Not on file  . Physically Abused: Not on file  . Sexually Abused: Not on file    Allergy: No Known Allergies  Current Outpatient Medications: No current outpatient medications on file.   Physical Exam: Physical Exam Vitals and nursing note reviewed.  HENT:     Head: Normocephalic and atraumatic.  Eyes:     Pupils: Pupils are equal, round, and reactive to light.  Neck:     Thyroid: No thyromegaly.  Cardiovascular:     Rate and Rhythm: Normal rate and  regular rhythm.  Pulmonary:     Effort: Pulmonary effort is normal.  Abdominal:     General: Bowel sounds are normal. There is no distension.     Palpations: Abdomen is soft.     Tenderness: There is no abdominal tenderness. There is no guarding or rebound.  Musculoskeletal:        General: Normal range of motion.     Cervical back: Normal range of motion and neck supple.  Skin:    General: Skin is warm and dry.  Neurological:     Mental Status: She is alert and oriented to person, place, and time.  Psychiatric:        Mood and Affect: Affect normal.        Judgment: Judgment normal.     Do you snore loudly? ( louder than talking or loud enough to be heard through closed doors?) NO Do you often feel tired, fatigued, or sleepy during daytime? NO Has anyone observed you stop breathing during sleep? NO Do you have or are you being treated for high blood pressure? NO  BMI> 35 YES Age> 50 NO Neck circumference > 40 cm 40 CM- yes Female gender? NO  ** if yes to > 3 questions high risk of obstructive sleep apnea ** if yes to <3 questions+ low risk for obstructive sleep apnea  Screen negative   Assessment: Ms. Coventry is a 24 y.o. G3P1001 [redacted]w[redacted]d based on Patient's last menstrual period was 10/10/2019. with an Estimated Date of Delivery: 07/16/20,  for prenatal care.  Plan:  1) Avoid alcoholic beverages. 2) Patient encouraged not to  smoke.  3) Discontinue the use of all non-medicinal drugs and chemicals.  4) Take prenatal vitamins daily.  5) Seatbelt use advised 6) Nutrition, food safety (fish, cheese advisories, and high nitrite foods) and exercise discussed. 7) Hospital and practice style delivering at [facility] discussed  8) Patient is asked about travel to areas at risk for the Zika virus, and counseled to avoid travel and exposure to mosquitoes or sexual partners who may have themselves been exposed to the virus. Testing is discussed, and will be ordered as appropriate.  9) [facility]birth classes at [facility] advised 10) Genetic Screening, such as with 1st Trimester Screening, cell free fetal DNA, AFP testing, and Ultrasound, as well as with amniocentesis and CVS as appropriate, is discussed with patient. She plans to have genetic testing this pregnancy.  Will return for early 1 GTT Reviewed plan to manage nausea in pregnancy Discussed weight gain goals, referral to nutrition, referral to anesthesia Dating [place] today.  Encouraged COVID vaccination.   Problem list reviewed and updated.  I discussed the assessment and treatment plan with the patient. The patient was provided an opportunity to ask questions and all were answered. The patient agreed with the plan and demonstrated an understanding of the instructions.  Adelene Idler MD [facility], [facility] 12/15/2019 8:54 AM

## 2019-12-15 NOTE — Patient Instructions (Addendum)
Recommended Weight Gain in Pregnancy     BMI    Underweight  Less than 18.5 28-40 lbs Normal weight  18.5-24.9  25-35 lbs Overweight  25-29.9  15-25 lbs Obese   30 and greater  11-20 lbs    Initial steps to help :   B6 (pyridoxine) 25 mg,  3-4 times a day- 200 mg a day total Unisom (doxylamine) 25 mg at bedtime **B6 and Unisom are available as a combination prescription medications called diclegis and bonjesta  B1 (thiamin)  50-100 mg 1-2 a day-  100 mg a day total  Continue prenatal vitamin with iron and thiamin. If it is not tolerated switch to 1 mg of folic acid.  Can add medication for gastric reflux if needed.  Subsequent steps to be added to B1, B6, and Unisom:  1. Antihistamine (one of the following medications) Dramamine      25-50 mg every 4-6 hours Benadryl      25-50 mg every 4-6 hours Meclizine      25 mg every 6 hours  2. Dopamine Antagonist (one of the following medications) Metoclopramide  (Reglan)  5-10 mg every 6-8 hours         PO Promethazine   (Phenergan)   12.5-25 mg every 4-6 hours      PO or rectal Prochlorperazine  (Compazine)  5-10 mg every 6-8 hours      BID rectally   Subsequent steps if there has still not been improvement in symptoms:  3. Daily stool softner:  Colace 100 mg twice a day  4. Ondansetron  (Zofran)   4-8 mg every 6-8 hours      Hyperemesis Gravidarum Hyperemesis gravidarum is a severe form of nausea and vomiting that happens during pregnancy. Hyperemesis is worse than morning sickness. It may cause you to have nausea or vomiting all day for many days. It may keep you from eating and drinking enough food and liquids, which can lead to dehydration, malnutrition, and weight loss. Hyperemesis usually occurs during the first half (the first 20 weeks) of pregnancy. It often goes away once a woman is in her second half of pregnancy. However, sometimes hyperemesis continues through an entire pregnancy. What are the causes? The cause of  this condition is not known. It may be related to changes in chemicals (hormones) in the body during pregnancy, such as the high level of pregnancy hormone (human chorionic gonadotropin) or the increase in the female sex hormone (estrogen). What are the signs or symptoms? Symptoms of this condition include:  Nausea that does not go away.  Vomiting that does not allow you to keep any food down.  Weight loss.  Body fluid loss (dehydration).  Having no desire to eat, or not liking food that you have previously enjoyed. How is this diagnosed? This condition may be diagnosed based on:  A physical exam.  Your medical history.  Your symptoms.  Blood tests.  Urine tests. How is this treated? This condition is managed by controlling symptoms. This may include:  Following an eating plan. This can help lessen nausea and vomiting.  Taking prescription medicines. An eating plan and medicines are often used together to help control symptoms. If medicines do not help relieve nausea and vomiting, you may need to receive fluids through an IV at the hospital. Follow these instructions at home: Eating and drinking   Avoid the following: ? Drinking fluids with meals. Try not to drink anything during the 30 minutes before and after  your meals. ? Drinking more than 1 cup of fluid at a time. ? Eating foods that trigger your symptoms. These may include spicy foods, coffee, high-fat foods, very sweet foods, and acidic foods. ? Skipping meals. Nausea can be more intense on an empty stomach. If you cannot tolerate food, do not force it. Try sucking on ice chips or other frozen items and make up for missed calories later. ? Lying down within 2 hours after eating. ? Being exposed to environmental triggers. These may include food smells, smoky rooms, closed spaces, rooms with strong smells, warm or humid places, overly loud and noisy rooms, and rooms with motion or flickering lights. Try eating meals in  a well-ventilated area that is free of strong smells. ? Quick and sudden changes in your movement. ? Taking iron pills and multivitamins that contain iron. If you take prescription iron pills, do not stop taking them unless your health care provider approves. ? Preparing food. The smell of food can spoil your appetite or trigger nausea.  To help relieve your symptoms: ? Listen to your body. Everyone is different and has different preferences. Find what works best for you. ? Eat and drink slowly. ? Eat 5-6 small meals daily instead of 3 large meals. Eating small meals and snacks can help you avoid an empty stomach. ? In the morning, before getting out of bed, eat a couple of crackers to avoid moving around on an empty stomach. ? Try eating starchy foods as these are usually tolerated well. Examples include cereal, toast, bread, potatoes, pasta, rice, and pretzels. ? Include at least 1 serving of protein with your meals and snacks. Protein options include lean meats, poultry, seafood, beans, nuts, nut butters, eggs, cheese, and yogurt. ? Try eating a protein-rich snack before bed. Examples of a protein-rick snack include cheese and crackers or a peanut butter sandwich made with 1 slice of whole-wheat bread and 1 tsp (5 g) of peanut butter. ? Eat or suck on things that have ginger in them. It may help relieve nausea. Add  tsp ground ginger to hot tea or choose ginger tea. ? Try drinking 100% fruit juice or an electrolyte drink. An electrolyte drink contains sodium, potassium, and chloride. ? Drink fluids that are cold, clear, and carbonated or sour. Examples include lemonade, ginger ale, lemon-lime soda, ice water, and sparkling water. ? Brush your teeth or use a mouth rinse after meals. ? Talk with your health care provider about starting a supplement of vitamin B6. General instructions  Take over-the-counter and prescription medicines only as told by your health care provider.  Follow  instructions from your health care provider about eating or drinking restrictions.  Continue to take your prenatal vitamins as told by your health care provider. If you are having trouble taking your prenatal vitamins, talk with your health care provider about different options.  Keep all follow-up and pre-birth (prenatal) visits as told by your health care provider. This is important. Contact a health care provider if:  You have pain in your abdomen.  You have a severe headache.  You have vision problems.  You are losing weight.  You feel weak or dizzy. Get help right away if:  You cannot drink fluids without vomiting.  You vomit blood.  You have constant nausea and vomiting.  You are very weak.  You faint.  You have a fever and your symptoms suddenly get worse. Summary  Hyperemesis gravidarum is a severe form of nausea and vomiting that  happens during pregnancy.  Making some changes to your eating habits may help relieve nausea and vomiting.  This condition may be managed with medicine.  If medicines do not help relieve nausea and vomiting, you may need to receive fluids through an IV at the hospital. This information is not intended to replace advice given to you by your health care provider. Make sure you discuss any questions you have with your health care provider. Document Revised: 04/08/2017 Document Reviewed: 11/16/2015 Elsevier Patient Education  2020 [employer].     First Trimester of Pregnancy The first trimester of pregnancy is from week 1 until the end of week 13 (months 1 through 3). A week after a sperm fertilizes an egg, the egg will implant on the wall of the uterus. This embryo will begin to develop into a baby. Genes from you and your partner will form the baby. The female genes will determine whether the baby will be a boy or a girl. At 6-8 weeks, the eyes and face will be formed, and the heartbeat can be seen on ultrasound. At the end of 12 weeks,  all the baby's organs will be formed. Now that you are pregnant, you will want to do everything you can to have a healthy baby. Two of the most important things are to get good prenatal care and to follow your health care provider's instructions. Prenatal care is all the medical care you receive before the baby's birth. This care will help prevent, find, and treat any problems during the pregnancy and childbirth. Body changes during your first trimester Your body goes through many changes during pregnancy. The changes vary from woman to woman.  You may gain or lose a couple of pounds at first.  You may feel sick to your stomach (nauseous) and you may throw up (vomit). If the vomiting is uncontrollable, call your health care provider.  You may tire easily.  You may develop headaches that can be relieved by medicines. All medicines should be approved by your health care provider.  You may urinate more often. Painful urination may mean you have a bladder infection.  You may develop heartburn as a result of your pregnancy.  You may develop constipation because certain hormones are causing the muscles that push stool through your intestines to slow down.  You may develop hemorrhoids or swollen veins (varicose veins).  Your breasts may begin to grow larger and become tender. Your nipples may stick out more, and the tissue that surrounds them (areola) may become darker.  Your gums may bleed and may be sensitive to brushing and flossing.  Dark spots or blotches (chloasma, mask of pregnancy) may develop on your face. This will likely fade after the baby is born.  Your menstrual periods will stop.  You may have a loss of appetite.  You may develop cravings for certain kinds of food.  You may have changes in your emotions from day to day, such as being excited to be pregnant or being concerned that something may go wrong with the pregnancy and baby.  You may have more vivid and strange  dreams.  You may have changes in your hair. These can include thickening of your hair, rapid growth, and changes in texture. Some women also have hair loss during or after pregnancy, or hair that feels dry or thin. Your hair will most likely return to normal after your baby is born. What to expect at prenatal visits During a routine prenatal visit:  You  will be weighed to make sure you and the baby are growing normally.  Your blood pressure will be taken.  Your abdomen will be measured to track your baby's growth.  The fetal heartbeat will be listened to between weeks 10 and 14 of your pregnancy.  Test results from any previous visits will be discussed. Your health care provider may ask you:  How you are feeling.  If you are feeling the baby move.  If you have had any abnormal symptoms, such as leaking fluid, bleeding, severe headaches, or abdominal cramping.  If you are using any tobacco products, including cigarettes, chewing tobacco, and electronic cigarettes.  If you have any questions. Other tests that may be performed during your first trimester include:  Blood tests to find your blood type and to check for the presence of any previous infections. The tests will also be used to check for low iron levels (anemia) and protein on red blood cells (Rh antibodies). Depending on your risk factors, or if you previously had diabetes during pregnancy, you may have tests to check for high blood sugar that affects pregnant women (gestational diabetes).  Urine tests to check for infections, diabetes, or protein in the urine.  An ultrasound to confirm the proper growth and development of the baby.  Fetal screens for spinal cord problems (spina bifida) and Down syndrome.  HIV (human immunodeficiency virus) testing. Routine prenatal testing includes screening for HIV, unless you choose not to have this test.  You may need other tests to make sure you and the baby are doing well. Follow  these instructions at home: Medicines  Follow your health care provider's instructions regarding medicine use. Specific medicines may be either safe or unsafe to take during pregnancy.  Take a prenatal vitamin that contains at least 600 micrograms (mcg) of folic acid.  If you develop constipation, try taking a stool softener if your health care provider approves. Eating and drinking   Eat a balanced diet that includes fresh fruits and vegetables, whole grains, good sources of protein such as meat, eggs, or tofu, and low-fat dairy. Your health care provider will help you determine the amount of weight gain that is right for you.  Avoid raw meat and uncooked cheese. These carry germs that can cause birth defects in the baby.  Eating four or five small meals rather than three large meals a day may help relieve nausea and vomiting. If you start to feel nauseous, eating a few soda crackers can be helpful. Drinking liquids between meals, instead of during meals, also seems to help ease nausea and vomiting.  Limit foods that are high in fat and processed sugars, such as fried and sweet foods.  To prevent constipation: ? Eat foods that are high in fiber, such as fresh fruits and vegetables, whole grains, and beans. ? Drink enough fluid to keep your urine clear or pale yellow. Activity  Exercise only as directed by your health care provider. Most women can continue their usual exercise routine during pregnancy. Try to exercise for 30 minutes at least 5 days a week. Exercising will help you: ? Control your weight. ? Stay in shape. ? Be prepared for labor and delivery.  Experiencing pain or cramping in the lower abdomen or lower back is a good sign that you should stop exercising. Check with your health care provider before continuing with normal exercises.  Try to avoid standing for long periods of time. Move your legs often if you must stand in  one place for a long time.  Avoid heavy  lifting.  Wear low-heeled shoes and practice good posture.  You may continue to have sex unless your health care provider tells you not to. Relieving pain and discomfort  Wear a good support bra to relieve breast tenderness.  Take warm sitz baths to soothe any pain or discomfort caused by hemorrhoids. Use hemorrhoid cream if your health care provider approves.  Rest with your legs elevated if you have leg cramps or low back pain.  If you develop varicose veins in your legs, wear support hose. Elevate your feet for 15 minutes, 3-4 times a day. Limit salt in your diet. Prenatal care  Schedule your prenatal visits by the twelfth week of pregnancy. They are usually scheduled monthly at first, then more often in the last 2 months before delivery.  Write down your questions. Take them to your prenatal visits.  Keep all your prenatal visits as told by your health care provider. This is important. Safety  Wear your seat belt at all times when driving.  Make a list of emergency phone numbers, including numbers for family, friends, the hospital, and police and fire departments. General instructions  Ask your health care provider for a referral to a local prenatal education class. Begin classes no later than the beginning of month 6 of your pregnancy.  Ask for help if you have counseling or nutritional needs during pregnancy. Your health care provider can offer advice or refer you to specialists for help with various needs.  Do not use hot tubs, steam rooms, or saunas.  Do not douche or use tampons or scented sanitary pads.  Do not cross your legs for long periods of time.  Avoid cat litter boxes and soil used by cats. These carry germs that can cause birth defects in the baby and possibly loss of the fetus by miscarriage or stillbirth.  Avoid all smoking, herbs, alcohol, and medicines not prescribed by your health care provider. Chemicals in these products affect the formation and  growth of the baby.  Do not use any products that contain nicotine or tobacco, such as cigarettes and e-cigarettes. If you need help quitting, ask your health care provider. You may receive counseling support and other resources to help you quit.  Schedule a dentist appointment. At home, brush your teeth with a soft toothbrush and be gentle when you floss. Contact a health care provider if:  You have dizziness.  You have mild pelvic cramps, pelvic pressure, or nagging pain in the abdominal area.  You have persistent nausea, vomiting, or diarrhea.  You have a bad smelling vaginal discharge.  You have pain when you urinate.  You notice increased swelling in your face, hands, legs, or ankles.  You are exposed to fifth disease or chickenpox.  You are exposed to [place] measles (rubella) and have never had it. Get help right away if:  You have a fever.  You are leaking fluid from your vagina.  You have spotting or bleeding from your vagina.  You have severe abdominal cramping or pain.  You have rapid weight gain or loss.  You vomit blood or material that looks like coffee grounds.  You develop a severe headache.  You have shortness of breath.  You have any kind of trauma, such as from a fall or a car accident. Summary  The first trimester of pregnancy is from week 1 until the end of week 13 (months 1 through 3).  Your body goes  through many changes during pregnancy. The changes vary from woman to woman.  You will have routine prenatal visits. During those visits, your health care provider will examine you, discuss any test results you may have, and talk with you about how you are feeling. This information is not intended to replace advice given to you by your health care provider. Make sure you discuss any questions you have with your health care provider. Document Revised: 03/01/2017 Document Reviewed: 02/29/2016 Elsevier Patient Education  2020 [employer].

## 2019-12-16 LAB — MONITOR DRUG PROFILE 10(MW)
Amphetamine Scrn, Ur: NEGATIVE ng/mL
BARBITURATE SCREEN URINE: NEGATIVE ng/mL
BENZODIAZEPINE SCREEN, URINE: NEGATIVE ng/mL
CANNABINOIDS UR QL SCN: NEGATIVE ng/mL
Cocaine (Metab) Scrn, Ur: NEGATIVE ng/mL
Creatinine(Crt), U: 191.9 mg/dL (ref 20.0–300.0)
Methadone Screen, Urine: NEGATIVE ng/mL
OXYCODONE+OXYMORPHONE UR QL SCN: NEGATIVE ng/mL
Opiate Scrn, Ur: NEGATIVE ng/mL
Ph of Urine: 7.2 (ref 4.5–8.9)
Phencyclidine Qn, Ur: NEGATIVE ng/mL
Propoxyphene Scrn, Ur: NEGATIVE ng/mL

## 2019-12-16 LAB — RPR+RH+ABO+RUB AB+AB SCR+CB...
Antibody Screen: NEGATIVE
HIV Screen 4th Generation wRfx: NONREACTIVE
Hematocrit: 40.3 % (ref 34.0–46.6)
Hemoglobin: 12.7 g/dL (ref 11.1–15.9)
Hepatitis B Surface Ag: NEGATIVE
MCH: 25.1 pg — ABNORMAL LOW (ref 26.6–33.0)
MCHC: 31.5 g/dL (ref 31.5–35.7)
MCV: 80 fL (ref 79–97)
Platelets: 274 10*3/uL (ref 150–450)
RBC: 5.05 x10E6/uL (ref 3.77–5.28)
RDW: 13.9 % (ref 11.7–15.4)
RPR Ser Ql: NONREACTIVE
Rh Factor: POSITIVE
Rubella Antibodies, IGG: 1.64 index (ref >0.99)
Varicella zoster IgG: <135 index — ABNORMAL LOW (ref >165)
WBC: 9.9 10*3/uL (ref 3.4–10.8)

## 2019-12-16 LAB — CERVICOVAGINAL ANCILLARY ONLY
Chlamydia: NEGATIVE
Comment: NEGATIVE
Comment: NEGATIVE
Comment: NORMAL
Neisseria Gonorrhea: NEGATIVE
Trichomonas: NEGATIVE

## 2019-12-19 LAB — URINE CULTURE

## 2019-12-29 ENCOUNTER — Encounter: Payer: Self-pay | Admitting: Obstetrics & Gynecology

## 2019-12-29 ENCOUNTER — Other Ambulatory Visit: Payer: 59

## 2019-12-29 ENCOUNTER — Other Ambulatory Visit (INDEPENDENT_AMBULATORY_CARE_PROVIDER_SITE_OTHER): Payer: 59

## 2019-12-29 ENCOUNTER — Ambulatory Visit (INDEPENDENT_AMBULATORY_CARE_PROVIDER_SITE_OTHER): Payer: 59 | Admitting: Obstetrics & Gynecology

## 2019-12-29 ENCOUNTER — Other Ambulatory Visit: Payer: Self-pay

## 2019-12-29 ENCOUNTER — Other Ambulatory Visit: Payer: Self-pay | Admitting: Obstetrics & Gynecology

## 2019-12-29 VITALS — BP 130/80 | Wt 268.0 lb

## 2019-12-29 DIAGNOSIS — Z23 Encounter for immunization: Secondary | ICD-10-CM | POA: Diagnosis not present

## 2019-12-29 DIAGNOSIS — Z3A11 11 weeks gestation of pregnancy: Secondary | ICD-10-CM | POA: Diagnosis not present

## 2019-12-29 DIAGNOSIS — Z3491 Encounter for supervision of normal pregnancy, unspecified, first trimester: Secondary | ICD-10-CM

## 2019-12-29 DIAGNOSIS — O0991 Supervision of high risk pregnancy, unspecified, first trimester: Secondary | ICD-10-CM

## 2019-12-29 DIAGNOSIS — O99211 Obesity complicating pregnancy, first trimester: Secondary | ICD-10-CM

## 2019-12-29 DIAGNOSIS — Z3689 Encounter for other specified antenatal screening: Secondary | ICD-10-CM

## 2019-12-29 DIAGNOSIS — Z1379 Encounter for other screening for genetic and chromosomal anomalies: Secondary | ICD-10-CM

## 2019-12-29 LAB — POCT URINALYSIS DIPSTICK OB
Glucose, UA: NEGATIVE
POC,PROTEIN,UA: NEGATIVE

## 2019-12-29 NOTE — Addendum Note (Signed)
Addended by: Cornelius Moras D on: 12/29/2019 10:35 AM   Modules accepted: Orders

## 2019-12-29 NOTE — Progress Notes (Signed)
Prenatal Visit Note Date: 12/29/2019 Clinic: [facility]  Subjective:  Ashley Austin is a 24 y.o. G2P1001 at [redacted]w[redacted]d being seen today for ongoing prenatal care.   She is currently monitored for the following issues for this high-risk pregnancy and has Supervision of high risk pregnancy, antepartum and Obesity in pregnancy, antepartum on their problem list.  Patient reports no complaints.    . Vag. Bleeding: None.   . Denies leaking of fluid.     Daily nausea, improving  The following portions of the patient's history were reviewed and updated as appropriate: allergies, current medications, past family history, past medical history, past social history, past surgical history and problem list. Problem list updated.  Objective:   Vitals:   12/29/19 0942  BP: 130/80  Weight: 268 lb (121.6 kg)    Fetal Status:           General:  Alert, oriented and cooperative. Patient is in no acute distress.  Skin: Skin is warm and dry. No rash noted.   Cardiovascular: Normal heart rate noted  Respiratory: Normal respiratory effort, no problems with respiration noted  Abdomen: Soft, gravid, appropriate for gestational age. Pain/Pressure: Absent     Pelvic:  Cervical exam deferred        Extremities: Normal range of motion.     Mental Status: Normal mood and affect. Normal behavior. Normal judgment and thought content.   Assessment and Plan:  Pregnancy: G2P1001 at [redacted]w[redacted]d  1. [redacted] weeks gestation of pregnancy PNV  2. Supervision of high risk pregnancy in first trimester  3. Obesity complicating pregnancy in first trimester ASA, Anes consult later, Growth [place], Nutritional goals discussed today, Glucola today  4. Encounter for genetic screening for Down Syndrome - MaterniT21 PLUS Core+SCA  5. Screening, antenatal, for fetal anatomic survey At 19-20 weeks, scheduled today - US OB Comp + 14 Wk; Future   pregnancy 2 Problems (from 10/10/19 to present)    Problem Noted Resolved   Supervision of  high risk pregnancy, antepartum     Overview Addendum 12/15/2019 12:43 PM by Natale Milch, MD     Nursing Staff Provider  Office Location  [facility] Dating   8 wk [place]  Language  English Anatomy US    Flu Vaccine  12/29/19 Genetic Screen  NIPS:     TDaP vaccine    Hgb A1C or  GTT Early : Third trimester :   Rhogam  n/a   LAB RESULTS   Feeding Plan  Blood Type   O+  Contraception  Antibody    Circumcision  Rubella    Pediatrician   RPR     Support Person  HBsAg     Prenatal Classes  discussed HIV      Varicella   BTL Consent  GBS  (For PCN allergy, check sensitivities)        VBAC Consent  not applicable Pap  2021 NIL    Hgb Electro      CF   Covid Vaccination  discussed, unvaccinated, had COVID July 2020 [facility]          High Risk Diagnoses: Obesity in pregnancy History of Gestational Hypertension- recommended 81 mg daily ASA at 12 weeks       Previous Version   Obesity in pregnancy, antepartum     Overview Signed 12/15/2019 12:40 PM by Natale Milch, MD    Starting weight: 267 Starting BMI: 50.60  Weight gain recommendation BMI> 30:  11-20 lbs  [x ] Screen of  OSA- negative screen  [x ] Early diabetes screening for BMI>30- ordered  [x ] Consultation with Anesthesia- referral planned   Antenatal Testing BMI 40 or > Weekly at 34 weeks            Return in about 4 weeks (around 01/26/2020) for ROB, and 8 weeks ROB w Anat [place].  Annamarie Major, MD, [facility], [facility] 12/29/2019  10:22 AM

## 2019-12-29 NOTE — Patient Instructions (Signed)

## 2019-12-30 LAB — GLUCOSE TOLERANCE, 1 HOUR: Glucose, 1Hr PP: 112 mg/dL (ref 65–199)

## 2020-01-03 LAB — MATERNIT21 PLUS CORE+SCA
Fetal Fraction: 9
Monosomy X (Turner Syndrome): NOT DETECTED
Result (T21): NEGATIVE
Trisomy 13 (Patau syndrome): NEGATIVE
Trisomy 18 (Edwards syndrome): NEGATIVE
Trisomy 21 (Down syndrome): NEGATIVE
XXX (Triple X Syndrome): NOT DETECTED
XXY (Klinefelter Syndrome): NOT DETECTED
XYY (Jacobs Syndrome): NOT DETECTED

## 2020-01-07 ENCOUNTER — Ambulatory Visit: Payer: 59 | Admitting: Dietician

## 2020-01-18 ENCOUNTER — Inpatient Hospital Stay: Admission: RE | Admit: 2020-01-18 | Payer: 59 | Source: Ambulatory Visit

## 2020-01-26 ENCOUNTER — Encounter: Payer: Self-pay | Admitting: Obstetrics and Gynecology

## 2020-01-26 ENCOUNTER — Other Ambulatory Visit: Payer: Self-pay

## 2020-01-26 ENCOUNTER — Ambulatory Visit (INDEPENDENT_AMBULATORY_CARE_PROVIDER_SITE_OTHER): Payer: Medicaid Other | Admitting: Obstetrics and Gynecology

## 2020-01-26 VITALS — BP 115/70 | Ht 61.0 in | Wt 271.8 lb

## 2020-01-26 DIAGNOSIS — O99210 Obesity complicating pregnancy, unspecified trimester: Secondary | ICD-10-CM

## 2020-01-26 DIAGNOSIS — O0990 Supervision of high risk pregnancy, unspecified, unspecified trimester: Secondary | ICD-10-CM

## 2020-01-26 DIAGNOSIS — Z3A15 15 weeks gestation of pregnancy: Secondary | ICD-10-CM

## 2020-01-26 LAB — POCT URINALYSIS DIPSTICK OB: Glucose, UA: NEGATIVE

## 2020-01-26 NOTE — Patient Instructions (Addendum)
[phone], preadmit office, for anesthesia referral   Second Trimester of Pregnancy The second trimester is from week 14 through week 27 (months 4 through 6). The second trimester is often a time when you feel your best. Your body has adjusted to being pregnant, and you begin to feel better physically. Usually, morning sickness has lessened or quit completely, you may have more energy, and you may have an increase in appetite. The second trimester is also a time when the fetus is growing rapidly. At the end of the sixth month, the fetus is about 9 inches long and weighs about 1 pounds. You will likely begin to feel the baby move (quickening) between 16 and 20 weeks of pregnancy. Body changes during your second trimester Your body continues to go through many changes during your second trimester. The changes vary from woman to woman.  Your weight will continue to increase. You will notice your lower abdomen bulging out.  You may begin to get stretch marks on your hips, abdomen, and breasts.  You may develop headaches that can be relieved by medicines. The medicines should be approved by your health care provider.  You may urinate more often because the fetus is pressing on your bladder.  You may develop or continue to have heartburn as a result of your pregnancy.  You may develop constipation because certain hormones are causing the muscles that push waste through your intestines to slow down.  You may develop hemorrhoids or swollen, bulging veins (varicose veins).  You may have back pain. This is caused by: ? Weight gain. ? Pregnancy hormones that are relaxing the joints in your pelvis. ? A shift in weight and the muscles that support your balance.  Your breasts will continue to grow and they will continue to become tender.  Your gums may bleed and may be sensitive to brushing and flossing.  Dark spots or blotches (chloasma, mask of pregnancy) may develop on your face. This will  likely fade after the baby is born.  A dark line from your belly button to the pubic area (linea nigra) may appear. This will likely fade after the baby is born.  You may have changes in your hair. These can include thickening of your hair, rapid growth, and changes in texture. Some women also have hair loss during or after pregnancy, or hair that feels dry or thin. Your hair will most likely return to normal after your baby is born. What to expect at prenatal visits During a routine prenatal visit:  You will be weighed to make sure you and the fetus are growing normally.  Your blood pressure will be taken.  Your abdomen will be measured to track your baby's growth.  The fetal heartbeat will be listened to.  Any test results from the previous visit will be discussed. Your health care provider may ask you:  How you are feeling.  If you are feeling the baby move.  If you have had any abnormal symptoms, such as leaking fluid, bleeding, severe headaches, or abdominal cramping.  If you are using any tobacco products, including cigarettes, chewing tobacco, and electronic cigarettes.  If you have any questions. Other tests that may be performed during your second trimester include:  Blood tests that check for: ? Low iron levels (anemia). ? High blood sugar that affects pregnant women (gestational diabetes) between 69 and 28 weeks. ? Rh antibodies. This is to check for a protein on red blood cells (Rh factor).  Urine tests  to check for infections, diabetes, or protein in the urine.  An ultrasound to confirm the proper growth and development of the baby.  An amniocentesis to check for possible genetic problems.  Fetal screens for spina bifida and Down syndrome.  HIV (human immunodeficiency virus) testing. Routine prenatal testing includes screening for HIV, unless you choose not to have this test. Follow these instructions at home: Medicines  Follow your health care provider's  instructions regarding medicine use. Specific medicines may be either safe or unsafe to take during pregnancy.  Take a prenatal vitamin that contains at least 600 micrograms (mcg) of folic acid.  If you develop constipation, try taking a stool softener if your health care provider approves. Eating and drinking   Eat a balanced diet that includes fresh fruits and vegetables, whole grains, good sources of protein such as meat, eggs, or tofu, and low-fat dairy. Your health care provider will help you determine the amount of weight gain that is right for you.  Avoid raw meat and uncooked cheese. These carry germs that can cause birth defects in the baby.  If you have low calcium intake from food, talk to your health care provider about whether you should take a daily calcium supplement.  Limit foods that are high in fat and processed sugars, such as fried and sweet foods.  To prevent constipation: ? Drink enough fluid to keep your urine clear or pale yellow. ? Eat foods that are high in fiber, such as fresh fruits and vegetables, whole grains, and beans. Activity  Exercise only as directed by your health care provider. Most women can continue their usual exercise routine during pregnancy. Try to exercise for 30 minutes at least 5 days a week. Stop exercising if you experience uterine contractions.  Avoid heavy lifting, wear low heel shoes, and practice good posture.  A sexual relationship may be continued unless your health care provider directs you otherwise. Relieving pain and discomfort  Wear a good support bra to prevent discomfort from breast tenderness.  Take warm sitz baths to soothe any pain or discomfort caused by hemorrhoids. Use hemorrhoid cream if your health care provider approves.  Rest with your legs elevated if you have leg cramps or low back pain.  If you develop varicose veins, wear support hose. Elevate your feet for 15 minutes, 3-4 times a day. Limit salt in your  diet. Prenatal Care  Write down your questions. Take them to your prenatal visits.  Keep all your prenatal visits as told by your health care provider. This is important. Safety  Wear your seat belt at all times when driving.  Make a list of emergency phone numbers, including numbers for family, friends, the hospital, and police and fire departments. General instructions  Ask your health care provider for a referral to a local prenatal education class. Begin classes no later than the beginning of month 6 of your pregnancy.  Ask for help if you have counseling or nutritional needs during pregnancy. Your health care provider can offer advice or refer you to specialists for help with various needs.  Do not use hot tubs, steam rooms, or saunas.  Do not douche or use tampons or scented sanitary pads.  Do not cross your legs for long periods of time.  Avoid cat litter boxes and soil used by cats. These carry germs that can cause birth defects in the baby and possibly loss of the fetus by miscarriage or stillbirth.  Avoid all smoking, herbs, alcohol, and unprescribed  drugs. Chemicals in these products can affect the formation and growth of the baby.  Do not use any products that contain nicotine or tobacco, such as cigarettes and e-cigarettes. If you need help quitting, ask your health care provider.  Visit your dentist if you have not gone yet during your pregnancy. Use a soft toothbrush to brush your teeth and be gentle when you floss. Contact a health care provider if:  You have dizziness.  You have mild pelvic cramps, pelvic pressure, or nagging pain in the abdominal area.  You have persistent nausea, vomiting, or diarrhea.  You have a bad smelling vaginal discharge.  You have pain when you urinate. Get help right away if:  You have a fever.  You are leaking fluid from your vagina.  You have spotting or bleeding from your vagina.  You have severe abdominal cramping or  pain.  You have rapid weight gain or weight loss.  You have shortness of breath with chest pain.  You notice sudden or extreme swelling of your face, hands, ankles, feet, or legs.  You have not felt your baby move in over an hour.  You have severe headaches that do not go away when you take medicine.  You have vision changes. Summary  The second trimester is from week 14 through week 27 (months 4 through 6). It is also a time when the fetus is growing rapidly.  Your body goes through many changes during pregnancy. The changes vary from woman to woman.  Avoid all smoking, herbs, alcohol, and unprescribed drugs. These chemicals affect the formation and growth your baby.  Do not use any tobacco products, such as cigarettes, chewing tobacco, and e-cigarettes. If you need help quitting, ask your health care provider.  Contact your health care provider if you have any questions. Keep all prenatal visits as told by your health care provider. This is important. This information is not intended to replace advice given to you by your health care provider. Make sure you discuss any questions you have with your health care provider. Document Revised: 07/11/2018 Document Reviewed: 04/24/2016 Elsevier Patient Education  2020 [employer].

## 2020-01-26 NOTE — Progress Notes (Signed)
Routine Prenatal Care Visit  Subjective  Ashley Austin is a 24 y.o. G2P1001 at [redacted]w[redacted]d being seen today for ongoing prenatal care.  She is currently monitored for the following issues for this high-risk pregnancy and has Supervision of high risk pregnancy, antepartum and Obesity in pregnancy, antepartum on their problem list.  ----------------------------------------------------------------------------------- Patient reports no complaints.   Contractions: Not present. Vag. Bleeding: None.  Movement: Absent. Denies leaking of fluid.  ----------------------------------------------------------------------------------- The following portions of the patient's history were reviewed and updated as appropriate: allergies, current medications, past family history, past medical history, past social history, past surgical history and problem list. Problem list updated.   Objective  Blood pressure 115/70, height 5\' 1"  (1.549 m), weight 271 lb 12.8 oz (123.3 kg), last menstrual period 10/10/2019. Pregravid weight 267 lb (121.1 kg) Total Weight Gain 4 lb 12.8 oz (2.177 kg) Urinalysis:      Fetal Status: Fetal Heart Rate (bpm): 150   Movement: Absent     General:  Alert, oriented and cooperative. Patient is in no acute distress.  Skin: Skin is warm and dry. No rash noted.   Cardiovascular: Normal heart rate noted  Respiratory: Normal respiratory effort, no problems with respiration noted  Abdomen: Soft, gravid, appropriate for gestational age. Pain/Pressure: Absent     Pelvic:  Cervical exam deferred        Extremities: Normal range of motion.  Edema: None  Mental Status: Normal mood and affect. Normal behavior. Normal judgment and thought content.     Assessment   24 y.o. G2P1001 at [redacted]w[redacted]d by  07/16/2020, by Last Menstrual Period presenting for routine prenatal visit  Plan   pregnancy 2 Problems (from 10/10/19 to present)    Problem Noted Resolved   Supervision of high risk pregnancy,  antepartum 12/15/2019 by 12/17/2019, MD No   Overview Addendum 01/26/2020  9:12 AM by 01/28/2020, MD     Nursing Staff Provider  Office Location  [facility] Dating   LMP, confirmed 8 wk Natale Milch  Language  English Anatomy US    Flu Vaccine  12/29/19 Genetic Screen  NIPS:   Normal XY   TDaP vaccine    Hgb A1C or  GTT Early : WNL Third trimester :   Rhogam  n/a   LAB RESULTS   Feeding Plan  Blood Type O/Positive/-- (09/14 0935)   Contraception  Antibody Negative (09/14 0935)  Circumcision  Rubella 1.64 (09/14 0935)  Pediatrician   RPR Non Reactive (09/14 0935)   Support Person  HBsAg Negative (09/14 0935)   Prenatal Classes  discussed HIV Non Reactive (09/14 0935)    Varicella   BTL Consent  GBS  (For PCN allergy, check sensitivities)        VBAC Consent  not applicable Pap  2021 NIL    Hgb Electro      CF   Covid Vaccination  discussed, unvaccinated, had COVID July 2020 [facility]          High Risk Diagnoses: Obesity in pregnancy History of Gestational Hypertension- recommended 81 mg daily ASA at 12 weeks       Previous Version   Obesity in pregnancy, antepartum 12/15/2019 by 12/17/2019, MD No   Overview Signed 12/15/2019 12:40 PM by 12/17/2019, MD    Starting weight: 267 Starting BMI: 50.60  Weight gain recommendation BMI> 30:  11-20 lbs  [x ] Screen of OSA- negative screen  [ ]  Early diabetes screening for BMI>30- ordered  [ ]   Consultation with Anesthesia- referral placed  Antenatal Testing BMI 35-39.9 Weekly at 37 weeks BMI 40 or > Weekly at 34 weeks           She is taking ASA   Do you snore loudly? ( louder than talking or loud enough to be heard through closed doors?) NO  Do you often feel tired, fatigued, or sleepy during daytime? NO  Has anyone observed you stop breathing during sleep? NO   Do you have or are you being treated for high blood pressure? NO  BMI> 35 YES  Age> 50 NO  Neck circumference > 40  cm 34 cm  Female gender? NO  ** if yes to > 3 questions high risk of obstructive sleep apnea ** if yes to <3 questions+ low risk for obstructive sleep apnea   Gestational age appropriate obstetric precautions including but not limited to vaginal bleeding, contractions, leaking of fluid and fetal movement were reviewed in detail with the patient.    Return in about 4 weeks (around 02/23/2020) for ROB and anatomy US.  Natale Milch MD [facility], [facility] 01/26/2020, 9:23 AM

## 2020-01-26 NOTE — Addendum Note (Signed)
Addended by: Adelene Idler on: 01/26/2020 09:28 AM   Modules accepted: Orders

## 2020-02-23 ENCOUNTER — Ambulatory Visit: Payer: 59

## 2020-02-23 ENCOUNTER — Encounter: Payer: 59 | Admitting: Obstetrics and Gynecology

## 2020-03-03 ENCOUNTER — Other Ambulatory Visit: Payer: Self-pay

## 2020-03-03 ENCOUNTER — Ambulatory Visit (INDEPENDENT_AMBULATORY_CARE_PROVIDER_SITE_OTHER): Payer: BLUE CROSS/BLUE SHIELD

## 2020-03-03 DIAGNOSIS — Z3A19 19 weeks gestation of pregnancy: Secondary | ICD-10-CM | POA: Diagnosis not present

## 2020-03-03 DIAGNOSIS — O0990 Supervision of high risk pregnancy, unspecified, unspecified trimester: Secondary | ICD-10-CM

## 2020-03-04 ENCOUNTER — Encounter: Payer: Self-pay | Admitting: Advanced Practice Midwife

## 2020-03-04 ENCOUNTER — Ambulatory Visit (INDEPENDENT_AMBULATORY_CARE_PROVIDER_SITE_OTHER): Payer: BLUE CROSS/BLUE SHIELD | Admitting: Advanced Practice Midwife

## 2020-03-04 VITALS — BP 124/78 | Wt 274.0 lb

## 2020-03-04 DIAGNOSIS — O0992 Supervision of high risk pregnancy, unspecified, second trimester: Secondary | ICD-10-CM

## 2020-03-04 DIAGNOSIS — Z3A20 20 weeks gestation of pregnancy: Secondary | ICD-10-CM

## 2020-03-04 DIAGNOSIS — O99210 Obesity complicating pregnancy, unspecified trimester: Secondary | ICD-10-CM

## 2020-03-04 NOTE — Progress Notes (Signed)
Routine Prenatal Care Visit  Subjective  Ashley Austin is a 24 y.o. G2P1001 at [redacted]w[redacted]d being seen today for ongoing prenatal care.  She is currently monitored for the following issues for this high-risk pregnancy and has Supervision of high risk pregnancy, antepartum and Obesity in pregnancy, antepartum on their problem list.  ----------------------------------------------------------------------------------- Patient reports no complaints.  We discussed results of anatomy scan done yesterday. Contractions: Not present. Vag. Bleeding: None.  Movement: Present. Leaking Fluid denies.  ----------------------------------------------------------------------------------- The following portions of the patient's history were reviewed and updated as appropriate: allergies, current medications, past family history, past medical history, past social history, past surgical history and problem list. Problem list updated.  Objective  Blood pressure 124/78, weight 274 lb (124.3 kg), last menstrual period 10/10/2019. Pregravid weight 267 lb (121.1 kg) Total Weight Gain 7 lb (3.175 kg) Urinalysis: Urine Protein    Urine Glucose    Fetal Status: Fetal Heart Rate (bpm): 139   Movement: Present  Presentation: Vertex   Anatomy scan: complete, normal, female, cephalic, placenta anterior  General:  Alert, oriented and cooperative. Patient is in no acute distress.  Skin: Skin is warm and dry. No rash noted.   Cardiovascular: Normal heart rate noted  Respiratory: Normal respiratory effort, no problems with respiration noted  Abdomen: Soft, gravid, appropriate for gestational age. Pain/Pressure: Absent     Pelvic:  Cervical exam deferred        Extremities: Normal range of motion.     Mental Status: Normal mood and affect. Normal behavior. Normal judgment and thought content.   Assessment   24 y.o. G2P1001 at [redacted]w[redacted]d by  07/16/2020, by Last Menstrual Period presenting for routine prenatal visit  Plan   pregnancy  2 Problems (from 10/10/19 to present)    Problem Noted Resolved   Supervision of high risk pregnancy, antepartum 12/15/2019 by Natale Milch, MD No   Overview Addendum 01/26/2020  9:26 AM by Natale Milch, MD     Nursing Staff Provider  Office Location  [facility] Dating   LMP, confirmed 8 wk [place]  Language  English Anatomy US    Flu Vaccine  12/29/19 Genetic Screen  NIPS:   Normal XY   TDaP vaccine    Hgb A1C or  GTT Early : WNL Third trimester :   Rhogam  n/a   LAB RESULTS   Feeding Plan  Blood Type O/Positive/-- (09/14 0935)   Contraception  Antibody Negative (09/14 0935)  Circumcision  Rubella 1.64 (09/14 0935)  Pediatrician   RPR Non Reactive (09/14 0935)   Support Person  HBsAg Negative (09/14 0935)   Prenatal Classes  discussed HIV Non Reactive (09/14 0935)    Varicella  Immune  BTL Consent  GBS  (For PCN allergy, check sensitivities)        VBAC Consent  not applicable Pap  2021 NIL    Hgb Electro      CF   Covid Vaccination  discussed, unvaccinated, had COVID July 2020 [facility]          High Risk Diagnoses: Obesity in pregnancy History of Gestational Hypertension- recommended 81 mg daily ASA at 12 weeks       Previous Version   Obesity in pregnancy, antepartum 12/15/2019 by Natale Milch, MD No   Overview Signed 12/15/2019 12:40 PM by Natale Milch, MD    Starting weight: 267 Starting BMI: 50.60  Weight gain recommendation BMI> 30:  11-20 lbs  [x ] Screen of OSA- negative screen  [ ]   Early diabetes screening for BMI>30- ordered  [ ]  Consultation with Anesthesia- referral placed  Antenatal Testing BMI 35-39.9 Weekly at 37 weeks BMI 40 or > Weekly at 34 weeks         Growth scans q 4 weeks beginning at 28 weeks   Preterm labor symptoms and general obstetric precautions including but not limited to vaginal bleeding, contractions, leaking of fluid and fetal movement were reviewed in detail with the patient.   Return in about 4  weeks (around 04/01/2020) for rob.  04/03/2020, CNM 03/04/2020 3:59 PM

## 2020-03-04 NOTE — Progress Notes (Signed)
No vb. No lof.  

## 2020-03-31 ENCOUNTER — Encounter: Payer: Self-pay | Admitting: Obstetrics & Gynecology

## 2020-03-31 ENCOUNTER — Other Ambulatory Visit: Payer: Self-pay

## 2020-03-31 ENCOUNTER — Ambulatory Visit (INDEPENDENT_AMBULATORY_CARE_PROVIDER_SITE_OTHER): Payer: BLUE CROSS/BLUE SHIELD | Admitting: Obstetrics & Gynecology

## 2020-03-31 VITALS — BP 122/80 | Wt 270.0 lb

## 2020-03-31 DIAGNOSIS — O0992 Supervision of high risk pregnancy, unspecified, second trimester: Secondary | ICD-10-CM

## 2020-03-31 DIAGNOSIS — Z3A24 24 weeks gestation of pregnancy: Secondary | ICD-10-CM

## 2020-03-31 DIAGNOSIS — O0990 Supervision of high risk pregnancy, unspecified, unspecified trimester: Secondary | ICD-10-CM

## 2020-03-31 DIAGNOSIS — O99212 Obesity complicating pregnancy, second trimester: Secondary | ICD-10-CM

## 2020-03-31 DIAGNOSIS — Z131 Encounter for screening for diabetes mellitus: Secondary | ICD-10-CM

## 2020-03-31 LAB — POCT URINALYSIS DIPSTICK OB
Glucose, UA: NEGATIVE
POC,PROTEIN,UA: NEGATIVE

## 2020-03-31 NOTE — Progress Notes (Signed)
  Subjective  Fetal Movement? yes Contractions? no Leaking Fluid? no Vaginal Bleeding? no  Objective  BP 122/80   Wt 270 lb (122.5 kg)   LMP 10/10/2019   BMI 51.02 kg/m  General: NAD Pumonary: no increased work of breathing Abdomen: gravid, non-tender Extremities: no edema Psychiatric: mood appropriate, affect full  Assessment  24 y.o. G2P1001 at [redacted]w[redacted]d by  07/16/2020, by Last Menstrual Period presenting for routine prenatal visit  Plan   Problem List Items Addressed This Visit    Supervision of high risk pregnancy, antepartum   Obesity in pregnancy, antepartum   [redacted] weeks gestation of pregnancy     -PNV -Glc nv     Relevant Orders   POC Urinalysis Dipstick OB (Completed)   Supervision of high risk pregnancy in second trimester     - APT third trimester - Growth scan 32 weeks - ASA daily - Anes consult requested   Screening for diabetes mellitus       Relevant Orders   28 Week RH+Panel      pregnancy 2 Problems (from 10/10/19 to present)    Problem Noted Resolved   Supervision of high risk pregnancy, antepartum     Overview Addendum 03/31/2020  8:58 AM by Nadara Mustard, MD     Nursing Staff Provider  Office Location  [facility] Dating   LMP, confirmed 8 wk [place]  Language  English Anatomy US  [facility]  Flu Vaccine  12/29/19 Genetic Screen  NIPS:   Normal XY   TDaP vaccine    Hgb A1C or  GTT Early : WNL Third trimester :   Rhogam  n/a   LAB RESULTS   Feeding Plan Breast Blood Type O/Positive/-- (09/14 0935)   Contraception n/a Antibody Negative (09/14 0935)  Circumcision no Rubella 1.64 (09/14 0935)  Pediatrician   RPR Non Reactive (09/14 0935)   Support Person  HBsAg Negative (09/14 0935)   Prenatal Classes  discussed HIV Non Reactive (09/14 0935)    Varicella  Immune  BTL Consent No GBS  (For PCN allergy, check sensitivities)   Anes Consult     VBAC Consent  not applicable Pap  2021 NIL    Hgb Electro      CF   Covid Vaccination  discussed, unvaccinated, had  COVID July 2020 [facility]         High Risk Diagnoses: Obesity in pregnancy History of Gestational Hypertension- recommended 81 mg daily ASA at 12 weeks       Annamarie Major, MD, [facility], [facility] 03/31/2020  8:59 AM

## 2020-03-31 NOTE — Patient Instructions (Signed)
Thank you for choosing [facility]. As part of our ongoing efforts to improve patient experience, we would appreciate your feedback. Please fill out the short survey that you will receive by mail or MyChart. Your opinion is important to us! -Dr Makynleigh Breslin   Third Trimester of Pregnancy The third trimester is from week 28 through week 40 (months 7 through 9). The third trimester is a time when the unborn baby (fetus) is growing rapidly. At the end of the ninth month, the fetus is about 20 inches in length and weighs 6-10 pounds. Body changes during your third trimester Your body will continue to go through many changes during pregnancy. The changes vary from woman to woman. During the third trimester:  Your weight will continue to increase. You can expect to gain 25-35 pounds (11-16 kg) by the end of the pregnancy.  You may begin to get stretch marks on your hips, abdomen, and breasts.  You may urinate more often because the fetus is moving lower into your pelvis and pressing on your bladder.  You may develop or continue to have heartburn. This is caused by increased hormones that slow down muscles in the digestive tract.  You may develop or continue to have constipation because increased hormones slow digestion and cause the muscles that push waste through your intestines to relax.  You may develop hemorrhoids. These are swollen veins (varicose veins) in the rectum that can itch or be painful.  You may develop swollen, bulging veins (varicose veins) in your legs.  You may have increased body aches in the pelvis, back, or thighs. This is due to weight gain and increased hormones that are relaxing your joints.  You may have changes in your hair. These can include thickening of your hair, rapid growth, and changes in texture. Some women also have hair loss during or after pregnancy, or hair that feels dry or thin. Your hair will most likely return to normal after your baby is born.  Your  breasts will continue to grow and they will continue to become tender. A yellow fluid (colostrum) may leak from your breasts. This is the first milk you are producing for your baby.  Your belly button may stick out.  You may notice more swelling in your hands, face, or ankles.  You may have increased tingling or numbness in your hands, arms, and legs. The skin on your belly may also feel numb.  You may feel short of breath because of your expanding uterus.  You may have more problems sleeping. This can be caused by the size of your belly, increased need to urinate, and an increase in your body's metabolism.  You may notice the fetus "dropping," or moving lower in your abdomen (lightening).  You may have increased vaginal discharge.  You may notice your joints feel loose and you may have pain around your pelvic bone. What to expect at prenatal visits You will have prenatal exams every 2 weeks until week 36. Then you will have weekly prenatal exams. During a routine prenatal visit:  You will be weighed to make sure you and the baby are growing normally.  Your blood pressure will be taken.  Your abdomen will be measured to track your baby's growth.  The fetal heartbeat will be listened to.  Any test results from the previous visit will be discussed.  You may have a cervical check near your due date to see if your cervix has softened or thinned (effaced).  You will be   tested for Group B streptococcus. This happens between 35 and 37 weeks. Your health care provider may ask you:  What your birth plan is.  How you are feeling.  If you are feeling the baby move.  If you have had any abnormal symptoms, such as leaking fluid, bleeding, severe headaches, or abdominal cramping.  If you are using any tobacco products, including cigarettes, chewing tobacco, and electronic cigarettes.  If you have any questions. Other tests or screenings that may be performed during your third  trimester include:  Blood tests that check for low iron levels (anemia).  Fetal testing to check the health, activity level, and growth of the fetus. Testing is done if you have certain medical conditions or if there are problems during the pregnancy.  Nonstress test (NST). This test checks the health of your baby to make sure there are no signs of problems, such as the baby not getting enough oxygen. During this test, a belt is placed around your belly. The baby is made to move, and its heart rate is monitored during movement. What is false labor? False labor is a condition in which you feel small, irregular tightenings of the muscles in the womb (contractions) that usually go away with rest, changing position, or drinking water. These are called Braxton Hicks contractions. Contractions may last for hours, days, or even weeks before true labor sets in. If contractions come at regular intervals, become more frequent, increase in intensity, or become painful, you should see your health care provider. What are the signs of labor?  Abdominal cramps.  Regular contractions that start at 10 minutes apart and become stronger and more frequent with time.  Contractions that start on the top of the uterus and spread down to the lower abdomen and back.  Increased pelvic pressure and dull back pain.  A watery or bloody mucus discharge that comes from the vagina.  Leaking of amniotic fluid. This is also known as your "water breaking." It could be a slow trickle or a gush. Let your health care provider know if it has a color or strange odor. If you have any of these signs, call your health care provider right away, even if it is before your due date. Follow these instructions at home: Medicines  Follow your health care provider's instructions regarding medicine use. Specific medicines may be either safe or unsafe to take during pregnancy.  Take a prenatal vitamin that contains at least 600 micrograms  (mcg) of folic acid.  If you develop constipation, try taking a stool softener if your health care provider approves. Eating and drinking   Eat a balanced diet that includes fresh fruits and vegetables, whole grains, good sources of protein such as meat, eggs, or tofu, and low-fat dairy. Your health care provider will help you determine the amount of weight gain that is right for you.  Avoid raw meat and uncooked cheese. These carry germs that can cause birth defects in the baby.  If you have low calcium intake from food, talk to your health care provider about whether you should take a daily calcium supplement.  Eat four or five small meals rather than three large meals a day.  Limit foods that are high in fat and processed sugars, such as fried and sweet foods.  To prevent constipation: ? Drink enough fluid to keep your urine clear or pale yellow. ? Eat foods that are high in fiber, such as fresh fruits and vegetables, whole grains, and beans. Activity    Exercise only as directed by your health care provider. Most women can continue their usual exercise routine during pregnancy. Try to exercise for 30 minutes at least 5 days a week. Stop exercising if you experience uterine contractions.  Avoid heavy lifting.  Do not exercise in extreme heat or humidity, or at high altitudes.  Wear low-heel, comfortable shoes.  Practice good posture.  You may continue to have sex unless your health care provider tells you otherwise. Relieving pain and discomfort  Take frequent breaks and rest with your legs elevated if you have leg cramps or low back pain.  Take warm sitz baths to soothe any pain or discomfort caused by hemorrhoids. Use hemorrhoid cream if your health care provider approves.  Wear a good support bra to prevent discomfort from breast tenderness.  If you develop varicose veins: ? Wear support pantyhose or compression stockings as told by your healthcare provider. ? Elevate  your feet for 15 minutes, 3-4 times a day. Prenatal care  Write down your questions. Take them to your prenatal visits.  Keep all your prenatal visits as told by your health care provider. This is important. Safety  Wear your seat belt at all times when driving.  Make a list of emergency phone numbers, including numbers for family, friends, the hospital, and police and fire departments. General instructions  Avoid cat litter boxes and soil used by cats. These carry germs that can cause birth defects in the baby. If you have a cat, ask someone to clean the litter box for you.  Do not travel far distances unless it is absolutely necessary and only with the approval of your health care provider.  Do not use hot tubs, steam rooms, or saunas.  Do not drink alcohol.  Do not use any products that contain nicotine or tobacco, such as cigarettes and e-cigarettes. If you need help quitting, ask your health care provider.  Do not use any medicinal herbs or unprescribed drugs. These chemicals affect the formation and growth of the baby.  Do not douche or use tampons or scented sanitary pads.  Do not cross your legs for long periods of time.  To prepare for the arrival of your baby: ? Take prenatal classes to understand, practice, and ask questions about labor and delivery. ? Make a trial run to the hospital. ? Visit the hospital and tour the maternity area. ? Arrange for maternity or paternity leave through employers. ? Arrange for family and friends to take care of pets while you are in the hospital. ? Purchase a rear-facing car seat and make sure you know how to install it in your car. ? Pack your hospital bag. ? Prepare the baby's nursery. Make sure to remove all pillows and stuffed animals from the baby's crib to prevent suffocation.  Visit your dentist if you have not gone during your pregnancy. Use a soft toothbrush to brush your teeth and be gentle when you floss. Contact a health  care provider if:  You are unsure if you are in labor or if your water has broken.  You become dizzy.  You have mild pelvic cramps, pelvic pressure, or nagging pain in your abdominal area.  You have lower back pain.  You have persistent nausea, vomiting, or diarrhea.  You have an unusual or bad smelling vaginal discharge.  You have pain when you urinate. Get help right away if:  Your water breaks before 37 weeks.  You have regular contractions less than 5 minutes apart before   37 weeks.  You have a fever.  You are leaking fluid from your vagina.  You have spotting or bleeding from your vagina.  You have severe abdominal pain or cramping.  You have rapid weight loss or weight gain.  You have shortness of breath with chest pain.  You notice sudden or extreme swelling of your face, hands, ankles, feet, or legs.  Your baby makes fewer than 10 movements in 2 hours.  You have severe headaches that do not go away when you take medicine.  You have vision changes. Summary  The third trimester is from week 28 through week 40, months 7 through 9. The third trimester is a time when the unborn baby (fetus) is growing rapidly.  During the third trimester, your discomfort may increase as you and your baby continue to gain weight. You may have abdominal, leg, and back pain, sleeping problems, and an increased need to urinate.  During the third trimester your breasts will keep growing and they will continue to become tender. A yellow fluid (colostrum) may leak from your breasts. This is the first milk you are producing for your baby.  False labor is a condition in which you feel small, irregular tightenings of the muscles in the womb (contractions) that eventually go away. These are called Braxton Hicks contractions. Contractions may last for hours, days, or even weeks before true labor sets in.  Signs of labor can include: abdominal cramps; regular contractions that start at 10  minutes apart and become stronger and more frequent with time; watery or bloody mucus discharge that comes from the vagina; increased pelvic pressure and dull back pain; and leaking of amniotic fluid. This information is not intended to replace advice given to you by your health care provider. Make sure you discuss any questions you have with your health care provider. Document Revised: 07/10/2018 Document Reviewed: 04/24/2016 Elsevier Patient Education  2020 [employer]  

## 2020-04-02 NOTE — L&D Delivery Note (Signed)
Delivery Note Primary OB: [facility] Delivery Physician: Annamarie Major, MD Gestational Age: Full term Antepartum complications: none Intrapartum complications: None  A viable Female was delivered via vertex presentation. "Rex" Apgars:8 ,9  Weight:  pending .   Placenta status: spontaneous and Intact.  Cord: 3+ vessels;  with the following complications: nuchal.  Anesthesia:  epidural Episiotomy:  none Lacerations:  Left Vaginal Side Wall Laceration Suture Repair: 2.0 vicryl Est. Blood Loss (mL):  less than 100 mL  Mom to postpartum.  Baby to Couplet care / Skin to Skin.  Annamarie Major, MD, [facility], [facility] 07/16/2020  2:28 AM [phone]

## 2020-04-21 ENCOUNTER — Other Ambulatory Visit: Payer: BLUE CROSS/BLUE SHIELD

## 2020-04-21 ENCOUNTER — Other Ambulatory Visit: Payer: Self-pay

## 2020-04-21 ENCOUNTER — Ambulatory Visit (INDEPENDENT_AMBULATORY_CARE_PROVIDER_SITE_OTHER): Payer: BLUE CROSS/BLUE SHIELD | Admitting: Advanced Practice Midwife

## 2020-04-21 ENCOUNTER — Encounter: Payer: Self-pay | Admitting: Advanced Practice Midwife

## 2020-04-21 VITALS — BP 120/80 | Wt 273.0 lb

## 2020-04-21 DIAGNOSIS — Z131 Encounter for screening for diabetes mellitus: Secondary | ICD-10-CM

## 2020-04-21 DIAGNOSIS — Z3A27 27 weeks gestation of pregnancy: Secondary | ICD-10-CM

## 2020-04-21 DIAGNOSIS — O99210 Obesity complicating pregnancy, unspecified trimester: Secondary | ICD-10-CM

## 2020-04-21 DIAGNOSIS — O0992 Supervision of high risk pregnancy, unspecified, second trimester: Secondary | ICD-10-CM

## 2020-04-21 LAB — OB RESULTS CONSOLE RUBELLA ANTIBODY, IGM: Rubella: IMMUNE

## 2020-04-21 LAB — OB RESULTS CONSOLE VARICELLA ZOSTER ANTIBODY, IGG: Varicella: NON-IMMUNE/NOT IMMUNE

## 2020-04-21 LAB — OB RESULTS CONSOLE HEPATITIS B SURFACE ANTIGEN: Hepatitis B Surface Ag: NEGATIVE

## 2020-04-21 NOTE — Progress Notes (Signed)
Routine Prenatal Care Visit  Subjective  Ashley Austin is a 25 y.o. G2P1001 at [redacted]w[redacted]d being seen today for ongoing prenatal care.  She is currently monitored for the following issues for this high-risk pregnancy and has Supervision of high risk pregnancy, antepartum and Obesity in pregnancy, antepartum on their problem list.  ----------------------------------------------------------------------------------- Patient reports no complaints. Her urine is orange color this morning. We discussed adequate hydration.  Contractions: Not present. Vag. Bleeding: None.  Movement: Present. Leaking Fluid denies.  ----------------------------------------------------------------------------------- The following portions of the patient's history were reviewed and updated as appropriate: allergies, current medications, past family history, past medical history, past social history, past surgical history and problem list. Problem list updated.  Objective  Blood pressure 120/80, weight 273 lb (123.8 kg), last menstrual period 10/10/2019. Pregravid weight 267 lb (121.1 kg) Total Weight Gain 6 lb (2.722 kg) Urinalysis: Urine Protein    Urine Glucose    Fetal Status: Fetal Heart Rate (bpm): 133   Movement: Present     General:  Alert, oriented and cooperative. Patient is in no acute distress.  Skin: Skin is warm and dry. No rash noted.   Cardiovascular: Normal heart rate noted  Respiratory: Normal respiratory effort, no problems with respiration noted  Abdomen: Soft, gravid, appropriate for gestational age. Pain/Pressure: Present     Pelvic:  Cervical exam deferred        Extremities: Normal range of motion.  Edema: None  Mental Status: Normal mood and affect. Normal behavior. Normal judgment and thought content.   Assessment   25 y.o. G2P1001 at [redacted]w[redacted]d by  07/16/2020, by Last Menstrual Period presenting for routine prenatal visit  Plan   pregnancy 2 Problems (from 10/10/19 to present)    Problem Noted  Resolved   Supervision of high risk pregnancy, antepartum 12/15/2019 by Natale Milch, MD No   Overview Addendum 03/31/2020  8:58 AM by Nadara Mustard, MD     Nursing Staff Provider  Office Location  [facility] Dating   LMP, confirmed 8 wk [place]  Language  English Anatomy US  [facility]  Flu Vaccine  12/29/19 Genetic Screen  NIPS:   Normal XY   TDaP vaccine    Hgb A1C or  GTT Early : WNL Third trimester :   Rhogam  n/a   LAB RESULTS   Feeding Plan Breast Blood Type O/Positive/-- (09/14 0935)   Contraception n/a Antibody Negative (09/14 0935)  Circumcision no Rubella 1.64 (09/14 0935)  Pediatrician   RPR Non Reactive (09/14 0935)   Support Person  HBsAg Negative (09/14 0935)   Prenatal Classes  discussed HIV Non Reactive (09/14 0935)    Varicella  Immune  BTL Consent No GBS  (For PCN allergy, check sensitivities)   Anes Consult     VBAC Consent  not applicable Pap  2021 NIL    Hgb Electro      CF   Covid Vaccination  discussed, unvaccinated, had COVID July 2020 [facility]         High Risk Diagnoses: Obesity in pregnancy History of Gestational Hypertension- recommended 81 mg daily ASA at 12 weeks       Previous Version   Obesity in pregnancy, antepartum 12/15/2019 by Natale Milch, MD No   Overview Signed 12/15/2019 12:40 PM by Natale Milch, MD    Starting weight: 267 Starting BMI: 50.60  Weight gain recommendation BMI> 30:  11-20 lbs  [x ] Screen of OSA- negative screen  [ ]  Early diabetes screening for BMI>30-  ordered  [ ]  Consultation with Anesthesia- referral placed  Antenatal Testing BMI 35-39.9 Weekly at 37 weeks BMI 40 or > Weekly at 34 weeks         28 wk labs today Needs growth scan next visit   Preterm labor symptoms and general obstetric precautions including but not limited to vaginal bleeding, contractions, leaking of fluid and fetal movement were reviewed in detail with the patient. Please refer to After Visit Summary for other counseling  recommendations.   Return in about 2 weeks (around 05/05/2020) for growth scan and rob (already has appt on 05/05/20 for rob)- please add growth scan.  07/03/20, CNM 04/21/2020 8:46 AM

## 2020-04-21 NOTE — Patient Instructions (Signed)

## 2020-04-22 LAB — 28 WEEK RH+PANEL
Basophils Absolute: 0 x10E3/uL (ref 0.0–0.2)
Basos: 0 %
EOS (ABSOLUTE): 0 x10E3/uL (ref 0.0–0.4)
Eos: 0 %
Gestational Diabetes Screen: 132 mg/dL (ref 65–139)
HIV Screen 4th Generation wRfx: NONREACTIVE
Hematocrit: 37.2 % (ref 34.0–46.6)
Hemoglobin: 12 g/dL (ref 11.1–15.9)
Immature Grans (Abs): 0 x10E3/uL (ref 0.0–0.1)
Immature Granulocytes: 1 %
Lymphocytes Absolute: 0.5 x10E3/uL — ABNORMAL LOW (ref 0.7–3.1)
Lymphs: 7 %
MCH: 25.5 pg — ABNORMAL LOW (ref 26.6–33.0)
MCHC: 32.3 g/dL (ref 31.5–35.7)
MCV: 79 fL (ref 79–97)
Monocytes Absolute: 0.2 x10E3/uL (ref 0.1–0.9)
Monocytes: 3 %
Neutrophils Absolute: 6.6 x10E3/uL (ref 1.4–7.0)
Neutrophils: 89 %
Platelets: 251 x10E3/uL (ref 150–450)
RBC: 4.71 x10E6/uL (ref 3.77–5.28)
RDW: 13.4 % (ref 11.7–15.4)
RPR Ser Ql: NONREACTIVE
WBC: 7.4 x10E3/uL (ref 3.4–10.8)

## 2020-04-26 ENCOUNTER — Other Ambulatory Visit: Payer: Self-pay

## 2020-04-26 ENCOUNTER — Encounter
Admission: RE | Admit: 2020-04-26 | Discharge: 2020-04-26 | Disposition: A | Discharge status: Home / Self Care | Payer: Medicaid Other | Source: Ambulatory Visit | Attending: Anesthesiology | Admitting: Anesthesiology

## 2020-04-26 NOTE — Consult Note (Signed)
[facility] Anesthesia Consultation  Ashley Austin ZOX:[id number] DOB: [birth year] DOA: 04/26/2020 PCP: Patient, No Pcp Per   Requesting physician: Jerene Pitch Date of consultation: 04/26/2020 Reason for consultation: Obesity during pregnancy  CHIEF COMPLAINT:  Obesity during pregnancy  HISTORY OF PRESENT ILLNESS: Ashley Austin  is a 25 y.o. female with a known history of Obesity in pregnancy and gestational Hypertension  PAST MEDICAL HISTORY:   Past Medical History:  Diagnosis Date  . Gestational hypertension     PAST SURGICAL HISTORY: No past surgical history on file.  SOCIAL HISTORY:  Social History   Tobacco Use  . Smoking status: Never Smoker  . Smokeless tobacco: Never Used  Substance Use Topics  . Alcohol use: Not Currently    FAMILY HISTORY:  Family History  Problem Relation Age of Onset  . Breast cancer Neg Hx   . Ovarian cancer Neg Hx     DRUG ALLERGIES: No Known Allergies  REVIEW OF SYSTEMS:   RESPIRATORY: No cough, shortness of breath, wheezing.  CARDIOVASCULAR: No chest pain, orthopnea, edema.  HEMATOLOGY: No anemia, easy bruising or bleeding SKIN: No rash or lesion. NEUROLOGIC: No tingling, numbness, weakness.  PSYCHIATRY: No anxiety or depression.   MEDICATIONS AT HOME:  Prior to Admission medications   Not on File      PHYSICAL EXAMINATION:   VITAL SIGNS: Height 5\' 1"  (1.549 m), weight 123.2 kg, last menstrual period 10/10/2019.  GENERAL:  25 y.o.-year-old patient no acute distress.  HEENT: Head atraumatic, normocephalic. Oropharynx and nasopharynx clear. MP 1, TM distance >3 cm, normal mouth opening. LUNGS: No use of accessory muscles of respiration.   EXTREMITIES: No pedal edema, cyanosis, or clubbing.  NEUROLOGIC: normal gait PSYCHIATRIC: The patient is alert and oriented x 3.  SKIN: No obvious rash, lesion, or ulcer.    IMPRESSION AND PLAN:   Ashley Austin  is a 25 y.o. female presenting with  obesity during pregnancy. BMI is currently 51.33 at [redacted] weeks gestation.   We discussed analgesic options during labor including epidural analgesia. Discussed that in obesity there can be increased difficulty with epidural placement or even failure of successful epidural. We also discussed that even after successful epidural placement there is increased risk of catheter migration out of the epidural space that would require catheter replacement. Discussed use of epidural vs spinal vs GA if cesarean delivery is required. Discussed increased risk of difficult intubation during pregnancy should an emergency cesarean delivery be required.   We discussed repeat evaluation at 35-36 weeks by anesthesia to determine whether there is a high risk of complications of anesthesia for which we would recommend transfer of OB care to a facility with a higher maternal level of care designation.    Patient did mention that she had a vaginal birth 7 years ago and did receive an epidural even though she was much heavier than she is now.

## 2020-05-03 DIAGNOSIS — Z419 Encounter for procedure for purposes other than remedying health state, unspecified: Secondary | ICD-10-CM | POA: Diagnosis not present

## 2020-05-05 ENCOUNTER — Other Ambulatory Visit: Payer: BLUE CROSS/BLUE SHIELD

## 2020-05-05 ENCOUNTER — Encounter: Payer: BLUE CROSS/BLUE SHIELD | Admitting: Obstetrics & Gynecology

## 2020-05-10 ENCOUNTER — Ambulatory Visit (INDEPENDENT_AMBULATORY_CARE_PROVIDER_SITE_OTHER): Payer: BLUE CROSS/BLUE SHIELD

## 2020-05-10 ENCOUNTER — Ambulatory Visit (INDEPENDENT_AMBULATORY_CARE_PROVIDER_SITE_OTHER): Payer: BLUE CROSS/BLUE SHIELD | Admitting: Obstetrics

## 2020-05-10 ENCOUNTER — Other Ambulatory Visit: Payer: Self-pay

## 2020-05-10 VITALS — BP 122/74 | Wt 276.0 lb

## 2020-05-10 DIAGNOSIS — O0992 Supervision of high risk pregnancy, unspecified, second trimester: Secondary | ICD-10-CM

## 2020-05-10 DIAGNOSIS — O99210 Obesity complicating pregnancy, unspecified trimester: Secondary | ICD-10-CM

## 2020-05-10 DIAGNOSIS — O99212 Obesity complicating pregnancy, second trimester: Secondary | ICD-10-CM

## 2020-05-10 DIAGNOSIS — O0990 Supervision of high risk pregnancy, unspecified, unspecified trimester: Secondary | ICD-10-CM

## 2020-05-10 DIAGNOSIS — Z23 Encounter for immunization: Secondary | ICD-10-CM

## 2020-05-10 DIAGNOSIS — Z3A30 30 weeks gestation of pregnancy: Secondary | ICD-10-CM

## 2020-05-10 LAB — POCT URINALYSIS DIPSTICK OB
Glucose, UA: NEGATIVE
POC,PROTEIN,UA: NEGATIVE

## 2020-05-10 NOTE — Progress Notes (Signed)
Routine Prenatal Care Visit  Subjective  Ashley Austin is a 25 y.o. G2P1001 at [redacted]w[redacted]d being seen today for ongoing prenatal care.  She is currently monitored for the following issues for this high-risk pregnancy and has Supervision of high risk pregnancy, antepartum and Obesity in pregnancy, antepartum on their problem list.  ----------------------------------------------------------------------------------- Patient reports no complaints.  She had a growth scan today. Growth at 35%; AC 24.4% and AFI 19.1 cms. Breech presentation today Contractions: Not present. Vag. Bleeding: None.  Movement: Present. Leaking Fluid denies.  ----------------------------------------------------------------------------------- The following portions of the patient's history were reviewed and updated as appropriate: allergies, current medications, past family history, past medical history, past social history, past surgical history and problem list. Problem list updated.  Objective  Blood pressure 122/74, weight 276 lb (125.2 kg), last menstrual period 10/10/2019. Pregravid weight 267 lb (121.1 kg) Total Weight Gain 9 lb (4.082 kg) Urinalysis: Urine Protein    Urine Glucose    Fetal Status:     Movement: Present     General:  Alert, oriented and cooperative. Patient is in no acute distress.  Skin: Skin is warm and dry. No rash noted.   Cardiovascular: Normal heart rate noted  Respiratory: Normal respiratory effort, no problems with respiration noted  Abdomen: Soft, gravid, appropriate for gestational age. Pain/Pressure: Absent     Pelvic:  Cervical exam deferred        Extremities: Normal range of motion.     Mental Status: Normal mood and affect. Normal behavior. Normal judgment and thought content.   Assessment   25 y.o. G2P1001 at [redacted]w[redacted]d by  07/16/2020, by Last Menstrual Period presenting for routine prenatal visit  Plan   pregnancy 2 Problems (from 10/10/19 to present)    Problem Noted Resolved    Supervision of high risk pregnancy, antepartum 12/15/2019 by Natale Milch, MD No   Overview Addendum 03/31/2020  8:58 AM by Nadara Mustard, MD     Nursing Staff Provider  Office Location  [facility] Dating   LMP, confirmed 8 wk [place]  Language  English Anatomy US  [facility]  Flu Vaccine  12/29/19 Genetic Screen  NIPS:   Normal XY   TDaP vaccine    Hgb A1C or  GTT Early : WNL Third trimester :   Rhogam  n/a   LAB RESULTS   Feeding Plan Breast Blood Type O/Positive/-- (09/14 0935)   Contraception n/a Antibody Negative (09/14 0935)  Circumcision no Rubella 1.64 (09/14 0935)  Pediatrician   RPR Non Reactive (09/14 0935)   Support Person  HBsAg Negative (09/14 0935)   Prenatal Classes  discussed HIV Non Reactive (09/14 0935)    Varicella  Immune  BTL Consent No GBS  (For PCN allergy, check sensitivities)   Anes Consult     VBAC Consent  not applicable Pap  2021 NIL    Hgb Electro      CF   Covid Vaccination  discussed, unvaccinated, had COVID July 2020 [facility]         High Risk Diagnoses: Obesity in pregnancy History of Gestational Hypertension- recommended 81 mg daily ASA at 12 weeks       Previous Version   Obesity in pregnancy, antepartum 12/15/2019 by Natale Milch, MD No   Overview Addendum 05/10/2020  4:52 PM by Mirna Mires, CNM    Starting weight: 267 Starting BMI: 50.60  Weight gain recommendation BMI> 30:  11-20 lbs  [x ] Screen of OSA- negative screen  [x ]  Early diabetes screening for BMI>30- ordered  [ ]  Consultation with Anesthesia- referral placed  Antenatal Testing BMI 35-39.9 Weekly at 37 weeks BMI 40 or > Weekly at 34 weeks        Previous Version       Preterm labor symptoms and general obstetric precautions including but not limited to vaginal bleeding, contractions, leaking of fluid and fetal movement were reviewed in detail with the patient. Please refer to After Visit Summary for other counseling recommendations.  jShe has had her  anesthesia appointment.  Return in about 2 weeks (around 05/24/2020) for return OB.  05/26/2020, CNM  05/10/2020 4:53 PM

## 2020-05-10 NOTE — Progress Notes (Signed)
TDAP today. No vb. No lof. U/s today

## 2020-05-23 ENCOUNTER — Other Ambulatory Visit: Payer: Self-pay | Admitting: Obstetrics & Gynecology

## 2020-05-23 DIAGNOSIS — O99212 Obesity complicating pregnancy, second trimester: Secondary | ICD-10-CM

## 2020-05-24 ENCOUNTER — Ambulatory Visit (INDEPENDENT_AMBULATORY_CARE_PROVIDER_SITE_OTHER): Payer: BLUE CROSS/BLUE SHIELD | Admitting: Obstetrics

## 2020-05-24 ENCOUNTER — Other Ambulatory Visit: Payer: Self-pay

## 2020-05-24 VITALS — BP 120/74 | Wt 278.0 lb

## 2020-05-24 DIAGNOSIS — O0990 Supervision of high risk pregnancy, unspecified, unspecified trimester: Secondary | ICD-10-CM

## 2020-05-24 DIAGNOSIS — O99210 Obesity complicating pregnancy, unspecified trimester: Secondary | ICD-10-CM

## 2020-05-24 DIAGNOSIS — Z3A32 32 weeks gestation of pregnancy: Secondary | ICD-10-CM

## 2020-05-24 NOTE — Progress Notes (Signed)
No vb. No lof.  

## 2020-05-24 NOTE — Progress Notes (Signed)
Routine Prenatal Care Visit  Subjective  Ashley Austin is a 25 y.o. G2P1001 at [redacted]w[redacted]d being seen today for ongoing prenatal care.  She is currently monitored for the following issues for this high-risk pregnancy and has Supervision of high risk pregnancy, antepartum and Obesity in pregnancy, antepartum on their problem list.  ----------------------------------------------------------------------------------- Patient reports no complaints.   Contractions: Not present. Vag. Bleeding: None.  Movement: Present. Leaking Fluid denies.  ----------------------------------------------------------------------------------- The following portions of the patient's history were reviewed and updated as appropriate: allergies, current medications, past family history, past medical history, past social history, past surgical history and problem list. Problem list updated.  Objective  Blood pressure 120/74, weight 278 lb (126.1 kg), last menstrual period 10/10/2019. Pregravid weight 267 lb (121.1 kg) Total Weight Gain 11 lb (4.99 kg) Urinalysis: Urine Protein    Urine Glucose    Fetal Status:     Movement: Present     General:  Alert, oriented and cooperative. Patient is in no acute distress.  Skin: Skin is warm and dry. No rash noted.   Cardiovascular: Normal heart rate noted  Respiratory: Normal respiratory effort, no problems with respiration noted  Abdomen: Soft, gravid, appropriate for gestational age. Pain/Pressure: Absent     Pelvic:  Cervical exam deferred        Extremities: Normal range of motion.     Mental Status: Normal mood and affect. Normal behavior. Normal judgment and thought content.   Assessment   25 y.o. G2P1001 at [redacted]w[redacted]d by  07/16/2020, by Last Menstrual Period presenting for routine prenatal visit  Plan   pregnancy 2 Problems (from 10/10/19 to present)    Problem Noted Resolved   Supervision of high risk pregnancy, antepartum 12/15/2019 by Natale Milch, MD No    Overview Addendum 03/31/2020  8:58 AM by Nadara Mustard, MD     Nursing Staff Provider  Office Location  [facility] Dating   LMP, confirmed 8 wk [place]  Language  English Anatomy US  [facility]  Flu Vaccine  12/29/19 Genetic Screen  NIPS:   Normal XY   TDaP vaccine    Hgb A1C or  GTT Early : WNL Third trimester :   Rhogam  n/a   LAB RESULTS   Feeding Plan Breast Blood Type O/Positive/-- (09/14 0935)   Contraception n/a Antibody Negative (09/14 0935)  Circumcision no Rubella 1.64 (09/14 0935)  Pediatrician   RPR Non Reactive (09/14 0935)   Support Person  HBsAg Negative (09/14 0935)   Prenatal Classes  discussed HIV Non Reactive (09/14 0935)    Varicella  Immune  BTL Consent No GBS  (For PCN allergy, check sensitivities)   Anes Consult     VBAC Consent  not applicable Pap  2021 NIL    Hgb Electro      CF   Covid Vaccination  discussed, unvaccinated, had COVID July 2020 [facility]         High Risk Diagnoses: Obesity in pregnancy History of Gestational Hypertension- recommended 81 mg daily ASA at 12 weeks       Previous Version   Obesity in pregnancy, antepartum 12/15/2019 by Natale Milch, MD No   Overview Addendum 05/10/2020  4:52 PM by Mirna Mires, CNM    Starting weight: 267 Starting BMI: 50.60  Weight gain recommendation BMI> 30:  11-20 lbs  [x ] Screen of OSA- negative screen  [x ] Early diabetes screening for BMI>30- ordered  [ ]  Consultation with Anesthesia- referral placed  Antenatal Testing  BMI 35-39.9 Weekly at 37 weeks BMI 40 or > Weekly at 34 weeks        Previous Version       Preterm labor symptoms and general obstetric precautions including but not limited to vaginal bleeding, contractions, leaking of fluid and fetal movement were reviewed in detail with the patient. Please refer to After Visit Summary for other counseling recommendations.   Return in about 2 weeks (around 06/07/2020) for return OB.  Mirna Mires, CNM  05/24/2020 9:08 AM

## 2020-05-31 DIAGNOSIS — Z419 Encounter for procedure for purposes other than remedying health state, unspecified: Secondary | ICD-10-CM | POA: Diagnosis not present

## 2020-06-07 ENCOUNTER — Other Ambulatory Visit: Payer: Self-pay

## 2020-06-07 ENCOUNTER — Encounter: Payer: Self-pay | Admitting: Obstetrics and Gynecology

## 2020-06-07 ENCOUNTER — Ambulatory Visit (INDEPENDENT_AMBULATORY_CARE_PROVIDER_SITE_OTHER): Payer: BLUE CROSS/BLUE SHIELD | Admitting: Obstetrics and Gynecology

## 2020-06-07 VITALS — BP 130/72 | Ht 61.0 in | Wt 284.4 lb

## 2020-06-07 DIAGNOSIS — O0990 Supervision of high risk pregnancy, unspecified, unspecified trimester: Secondary | ICD-10-CM

## 2020-06-07 DIAGNOSIS — Z3A34 34 weeks gestation of pregnancy: Secondary | ICD-10-CM

## 2020-06-07 DIAGNOSIS — O99210 Obesity complicating pregnancy, unspecified trimester: Secondary | ICD-10-CM

## 2020-06-07 LAB — POCT URINALYSIS DIPSTICK OB
Glucose, UA: NEGATIVE
POC,PROTEIN,UA: NEGATIVE

## 2020-06-07 NOTE — Patient Instructions (Signed)

## 2020-06-07 NOTE — Progress Notes (Signed)
Routine Prenatal Care Visit  Subjective  Ashley Austin is a 25 y.o. G2P1001 at [redacted]w[redacted]d being seen today for ongoing prenatal care.  She is currently monitored for the following issues for this high-risk pregnancy and has Supervision of high risk pregnancy, antepartum and Obesity in pregnancy, antepartum on their problem list.  ----------------------------------------------------------------------------------- Patient reports no complaints.   Contractions: Not present. Vag. Bleeding: None.  Movement: Present. Denies leaking of fluid.  ----------------------------------------------------------------------------------- The following portions of the patient's history were reviewed and updated as appropriate: allergies, current medications, past family history, past medical history, past social history, past surgical history and problem list. Problem list updated.   Objective  Blood pressure 130/72, height 5\' 1"  (1.549 m), weight 284 lb 6.4 oz (129 kg), last menstrual period 10/10/2019. Pregravid weight 267 lb (121.1 kg) Total Weight Gain 17 lb 6.4 oz (7.893 kg) Urinalysis:      Fetal Status: Fetal Heart Rate (bpm): 134   Movement: Present     General:  Alert, oriented and cooperative. Patient is in no acute distress.  Skin: Skin is warm and dry. No rash noted.   Cardiovascular: Normal heart rate noted  Respiratory: Normal respiratory effort, no problems with respiration noted  Abdomen: Soft, gravid, appropriate for gestational age. Pain/Pressure: Absent     Pelvic:  Cervical exam deferred        Extremities: Normal range of motion.  Edema: None  Mental Status: Normal mood and affect. Normal behavior. Normal judgment and thought content.     Assessment   25 y.o. G2P1001 at [redacted]w[redacted]d by  07/16/2020, by Last Menstrual Period presenting for routine prenatal visit  Plan   pregnancy 2 Problems (from 10/10/19 to present)    Problem Noted Resolved   Supervision of high risk pregnancy,  antepartum 12/15/2019 by 12/17/2019, MD No   Overview Addendum 06/07/2020  9:30 AM by 08/07/2020, MD     Nursing Staff Provider  Office Location  [facility] Dating   LMP, confirmed 8 wk Natale Milch  Language  English Anatomy US  [facility]  Flu Vaccine  12/29/19 Genetic Screen  NIPS:   Normal XY   TDaP vaccine   05/10/2020 Hgb A1C or  GTT Early : WNL Third trimester : 132  Rhogam  n/a   LAB RESULTS   Feeding Plan Breast Blood Type O/Positive/-- (09/14 0935)   Contraception n/a Antibody Negative (09/14 0935)  Circumcision no Rubella 1.64 (09/14 0935)  Pediatrician   RPR Non Reactive (09/14 0935)   Support Person  HBsAg Negative (09/14 0935)   Prenatal Classes  discussed HIV Non Reactive (09/14 0935)    Varicella  Immune  BTL Consent No GBS  (For PCN allergy, check sensitivities)   Anes Consult     VBAC Consent  not applicable Pap  2021 NIL    Hgb Electro      CF   Covid Vaccination  discussed, unvaccinated, had COVID July 2020 [facility]         High Risk Diagnoses: Obesity in pregnancy History of Gestational Hypertension- recommended 81 mg daily ASA at 12 weeks       Previous Version   Obesity in pregnancy, antepartum 12/15/2019 by 12/17/2019, MD No   Overview Addendum 05/10/2020  4:52 PM by 07/08/2020, CNM    Starting weight: 267 Starting BMI: 50.60  Weight gain recommendation BMI> 30:  11-20 lbs  [x ] Screen of OSA- negative screen  [x ] Early diabetes screening for  BMI>30- ordered  [x ] Consultation with Anesthesia- referral placed  Antenatal Testing BMI 35-39.9 Weekly at 37 weeks BMI 40 or > Weekly at 34 weeks        Previous Version       Discussed breastfeeding classes Will need anesthesia follow up per Dr. Noralyn Pick note, discussed with Tresa Endo who will follow up with patient to schedule. Start NSTs next visit weekly.   Gestational age appropriate obstetric precautions including but not limited to vaginal bleeding, contractions, leaking of fluid  and fetal movement were reviewed in detail with the patient.    Return in about 1 week (around 06/14/2020) for ROB and NST  and growth [place] in person with MD.  Natale Milch MD [facility], [facility] 06/07/2020, 9:40 AM

## 2020-06-15 ENCOUNTER — Ambulatory Visit (INDEPENDENT_AMBULATORY_CARE_PROVIDER_SITE_OTHER): Payer: BLUE CROSS/BLUE SHIELD

## 2020-06-15 ENCOUNTER — Other Ambulatory Visit: Payer: Self-pay

## 2020-06-15 DIAGNOSIS — O99210 Obesity complicating pregnancy, unspecified trimester: Secondary | ICD-10-CM | POA: Diagnosis not present

## 2020-06-15 DIAGNOSIS — O0990 Supervision of high risk pregnancy, unspecified, unspecified trimester: Secondary | ICD-10-CM | POA: Diagnosis not present

## 2020-06-16 ENCOUNTER — Ambulatory Visit (INDEPENDENT_AMBULATORY_CARE_PROVIDER_SITE_OTHER): Payer: BLUE CROSS/BLUE SHIELD | Admitting: Obstetrics & Gynecology

## 2020-06-16 ENCOUNTER — Encounter: Payer: Self-pay | Admitting: Obstetrics & Gynecology

## 2020-06-16 VITALS — BP 122/82 | Wt 283.0 lb

## 2020-06-16 DIAGNOSIS — O0993 Supervision of high risk pregnancy, unspecified, third trimester: Secondary | ICD-10-CM

## 2020-06-16 DIAGNOSIS — Z3A35 35 weeks gestation of pregnancy: Secondary | ICD-10-CM

## 2020-06-16 DIAGNOSIS — O99210 Obesity complicating pregnancy, unspecified trimester: Secondary | ICD-10-CM

## 2020-06-16 LAB — POCT URINALYSIS DIPSTICK OB
Glucose, UA: NEGATIVE
POC,PROTEIN,UA: NEGATIVE

## 2020-06-16 NOTE — Patient Instructions (Signed)
Breastfeeding and Inducing Lactation The process of producing breast milk starts when you get pregnant. At this time, hormones in your body change to prepare your body to make breast milk. When your baby is born, your hormones send signals that tell your body to make more breast milk and to release your breast milk. In some cases, however, you may want to breastfeed even though you did not become pregnant and you did not give birth to your baby. In this case, your breast milk will need to be induced. Induced lactation is a process in which a woman who is not producing breast milk is made to produce it. Induced lactation may be done in cases of:  Adoption.  Having another woman give birth to your baby (surrogacy).  Female same-sex couples who have a newborn. In these cases, one person may have given birth, and the other may want to make breast milk to help with feedings. You may also have induced lactation if you are restarting breastfeeding after stopping it for a period of time (relactation). Relactation is possible and often can be done without the use of medicines. Induced lactation is more likely to be successful in women who have been pregnant before. How does induced lactation work? Induced lactation reproduces the process that the body naturally goes through to make breast milk. To help make breast milk, you may need to take medicines and practice breast stimulation techniques. To do this, you will follow this schedule:  3-4 months before breastfeeding start date, begin taking medicines. Stop taking these medicines about 6 weeks before the planned breastfeeding date.  About 6 weeks before the planned breastfeeding date, start doing breast stimulation techniques several times a day. Breast stimulation mimics a baby suckling at the breast. You can do this by: ? Gently rubbing and stretching your nipples by hand. ? Pumping your breasts using a hospital-grade double electric breast pump.  You  may need to pump or rub both breasts several times throughout the day. For example, you may need to pump or rub at the same time every 3 hours for 20 minutes, for a total of 8 times a day. When your body is making milk and you start breastfeeding, your body will increase the amount of milk it makes. Your body does this naturally as milk is removed from your body, and as you sense changes in how your baby smells, sounds, and feels.   How does induced lactation affect me? Induced lactation may cause you to experience some changes in your body, such as:  Mild to moderate changes in your monthly periods (menstrual cycle).  Some breast changes that include a feeling of fullness.  Some changes in your breast shape.  Milk leaking from your breasts at times. Will I make enough milk to feed my baby? Very few women can make all the milk their babies need. If you choose induced lactation, you may need to supplement feedings with donated breast milk or infant formula to make sure your baby gets enough nutrition.  Supplemental nursing systems are available to give extra donated breast milk or formula at the breast while a baby nurses. These systems make sure that a baby gets enough nutrition during breastfeeding. Ask a breastfeeding specialist (lactationconsultant) for help finding and using this system.  Babies younger than one month old usually root at and accept the breast when supplemental nursing systems are used. Rooting is when a baby turns his or her head and opens his or her mouth  upon being stroked on the cheek or lips. Follow these instructions at home: Medicines  Take over-the-counter and prescription medicines only as told by your health care provider or trained lactation consultant. Always check with your health care provider before using any herbal medicines. General instructions  If you need guidance, talk to your health care provider or lactation consultant. He or she may be able to help  you start a milk supply and advise you in making important decisions about feeding your baby.  Keep all follow-up visits. This is important. Where to find more information  [employer]: llli.org  American Academy of Pediatrics: healthychildren.org Contact a health care provider if:  Your breasts become swollen, red, and tender.  You develop a fever.  Your baby is older than 29 days old and he or she: ? Does not seem satisfied after feeding at the breast. ? Is not producing 5-6 wet diapers a day. ? Is not producing 3 stools a day. ? Is not gaining weight. ? Is very tired (lethargic) or very sleepy. Summary  Induced lactation is a process in which a woman who is not producing breast milk is made to produce it.  Pregnancy naturally prepares the breasts to make breast milk.  Lactation is usually induced by taking medicines and practicing breast stimulation techniques.  Very few women can make all the milk their babies need through inducing lactation. You may need to supplement feedings with donated breast milk or infant formula to make sure your baby gets enough nutrition. This information is not intended to replace advice given to you by your health care provider. Make sure you discuss any questions you have with your health care provider. Document Revised: 11/04/2019 Document Reviewed: 11/04/2019 Elsevier Patient Education  2021 [employer].

## 2020-06-16 NOTE — Progress Notes (Signed)
  Subjective  Fetal Movement? yes Contractions? no Leaking Fluid? no Vaginal Bleeding? no  Objective  BP 122/82   Wt 283 lb (128.4 kg)   LMP 10/10/2019   BMI 53.47 kg/m  General: NAD Pumonary: no increased work of breathing Abdomen: gravid, non-tender Extremities: no edema Psychiatric: mood appropriate, affect full A NST procedure was performed with FHR monitoring and a normal baseline established, appropriate time of 20-40 minutes of evaluation, and accels >2 seen w 15x15 characteristics.  Results show a REACTIVE NST.   Review of ULTRASOUND.    I have personally reviewed images and report of recent ultrasound done at [facility].    Plan of management to be discussed with patient. AFI and growth normal  Assessment  25 y.o. G2P1001 at [redacted]w[redacted]d by  07/16/2020, by Last Menstrual Period presenting for routine prenatal visit  Plan   Problem List Items Addressed This Visit      Other   Supervision of high risk pregnancy, antepartum   Obesity in pregnancy, antepartum - Primary   Relevant Orders   US OB Follow Up    Other Visit Diagnoses    [redacted] weeks gestation of pregnancy       Relevant Orders   US OB Follow Up         Nursing Staff Provider  Office Location  [facility] Dating   LMP, confirmed 8 wk [place]  Language  English Anatomy US  [facility]  Flu Vaccine  12/29/19 Genetic Screen  NIPS:   Normal XY   TDaP vaccine   05/10/2020 Hgb A1C or  GTT Early : WNL Third trimester : 132  Rhogam  n/a   LAB RESULTS   Feeding Plan Breast Blood Type O/Positive/-- (09/14 0935)   Contraception Not sure Antibody Negative (09/14 0935)  Circumcision no Rubella 1.64 (09/14 0935)  Pediatrician   RPR Non Reactive (09/14 0935)   Support Person  HBsAg Negative (09/14 0935)   Prenatal Classes  discussed HIV Non Reactive (09/14 0935)    Varicella  Immune  BTL Consent No GBS  (For PCN allergy, check sensitivities)   Anes Consult     VBAC Consent  not applicable Pap  2021 NIL    Hgb Electro      CF    Covid Vaccination  discussed, unvaccinated, had COVID July 2020 [facility]         High Risk Diagnoses: Obesity in pregnancy History of Gestational Hypertension- recommended 81 mg daily ASA at 12 weeks       Previous Version   Obesity in pregnancy, antepartum     Overview Addendum 05/10/2020  4:52 PM by Mirna Mires, CNM    Starting weight: 267 Starting BMI: 50.60  Weight gain recommendation BMI> 30:  11-20 lbs  [x ] Screen of OSA- negative screen  [x ] Early diabetes screening for BMI>30- ordered  [x ] Consultation with Anesthesia- referral placed  Antenatal Testing BMI 35-39.9 Weekly at 37 weeks BMI 40 or > Weekly at 34 weeks        Annamarie Major, MD, [facility], [facility] 06/16/2020  2:40 PM

## 2020-06-24 ENCOUNTER — Other Ambulatory Visit
Admission: RE | Admit: 2020-06-24 | Discharge: 2020-06-24 | Disposition: A | Discharge status: Home / Self Care | Payer: Medicaid Other | Source: Ambulatory Visit | Attending: Obstetrics and Gynecology | Admitting: Obstetrics and Gynecology

## 2020-06-24 ENCOUNTER — Other Ambulatory Visit: Payer: Self-pay

## 2020-06-24 NOTE — Consult Note (Signed)
Ashley Austin  Ashley Austin [id number] DOB: 1995-11-28 DOA: 06/24/2020 PCP: Alan Mulder, MD   Requesting physician: Dr. Tiburcio Pea Date of Austin: 06/24/20 Reason for Austin: Obesity during pregnancy  CHIEF COMPLAINT:  Obesity during pregnancy  HISTORY OF PRESENT ILLNESS: Ashley Austin  is a 25 y.o. female with a known history of obesity during pregnancy. Denies hx of cardiovascular disease. Denies hx of asthma. Denies personal or family hx of bleeding disorders. Prior vaginal delivery with epidural for labor analgesia.   PAST MEDICAL HISTORY:   Past Medical History:  Diagnosis Date  . Gestational hypertension     PAST SURGICAL HISTORY: No past surgical history on file.  SOCIAL HISTORY:  Social History   Tobacco Use  . Smoking status: Never Smoker  . Smokeless tobacco: Never Used  Substance Use Topics  . Alcohol use: Not Currently    FAMILY HISTORY:  Family History  Problem Relation Age of Onset  . Breast cancer Neg Hx   . Ovarian cancer Neg Hx     DRUG ALLERGIES: No Known Allergies  REVIEW OF SYSTEMS:   RESPIRATORY: No cough, shortness of breath, wheezing.  CARDIOVASCULAR: No chest pain, orthopnea, edema.  HEMATOLOGY: No anemia, easy bruising or bleeding SKIN: No rash or lesion. NEUROLOGIC: No tingling, numbness, weakness.  PSYCHIATRY: No anxiety or depression.   MEDICATIONS AT HOME:  Prior to Admission medications   Not on File      PHYSICAL EXAMINATION:   VITAL SIGNS: Last menstrual period 10/10/2019.  GENERAL:  25 y.o.-year-old patient no acute distress.  HEENT: Head atraumatic, normocephalic. Oropharynx and nasopharynx clear. MP 2, TM distance >3 cm, normal mouth opening, grade 1 upper lip bite LUNGS: No use of accessory muscles of respiration.   EXTREMITIES: No pedal edema, cyanosis, or clubbing.  NEUROLOGIC: normal gait PSYCHIATRIC: The patient is alert and oriented x  3.  SKIN: No obvious rash, lesion, or ulcer.    IMPRESSION AND PLAN:   Ashley Austin  is a 25 y.o. female presenting with obesity during pregnancy. BMI is currently 53 at [redacted] weeks gestation.   Airway exam is reassuring. Significant back adiposity, spinal interspaces minimally palpable.  We discussed analgesic options during labor including epidural analgesia. Discussed that in obesity there can be increased difficulty with epidural placement or even failure of successful epidural. We also discussed that even after successful epidural placement there is increased risk of catheter migration out of the epidural space that would require catheter replacement. Discussed use of epidural vs spinal vs GA if cesarean delivery is required. Discussed increased risk of difficult intubation during pregnancy should an emergency cesarean delivery be required.   Plan for delivery at [facility].

## 2020-06-27 ENCOUNTER — Other Ambulatory Visit (HOSPITAL_COMMUNITY)
Admission: RE | Admit: 2020-06-27 | Discharge: 2020-06-27 | Disposition: A | Discharge status: Home / Self Care | Payer: Medicaid Other | Source: Ambulatory Visit | Attending: Obstetrics and Gynecology | Admitting: Obstetrics and Gynecology

## 2020-06-27 ENCOUNTER — Other Ambulatory Visit: Payer: Self-pay

## 2020-06-27 ENCOUNTER — Encounter: Payer: Self-pay | Admitting: Obstetrics and Gynecology

## 2020-06-27 ENCOUNTER — Ambulatory Visit (INDEPENDENT_AMBULATORY_CARE_PROVIDER_SITE_OTHER): Payer: BLUE CROSS/BLUE SHIELD | Admitting: Obstetrics and Gynecology

## 2020-06-27 VITALS — BP 128/72 | Ht 61.0 in | Wt 287.0 lb

## 2020-06-27 DIAGNOSIS — Z3A37 37 weeks gestation of pregnancy: Secondary | ICD-10-CM

## 2020-06-27 DIAGNOSIS — O0990 Supervision of high risk pregnancy, unspecified, unspecified trimester: Secondary | ICD-10-CM | POA: Diagnosis not present

## 2020-06-27 DIAGNOSIS — O99210 Obesity complicating pregnancy, unspecified trimester: Secondary | ICD-10-CM

## 2020-06-27 LAB — FETAL NONSTRESS TEST

## 2020-06-27 LAB — OB RESULTS CONSOLE GC/CHLAMYDIA: Gonorrhea: NEGATIVE

## 2020-06-27 NOTE — Patient Instructions (Signed)

## 2020-06-27 NOTE — Progress Notes (Signed)
Routine Prenatal Care Visit  Subjective  Ashley Austin is a 25 y.o. G2P1001 at [redacted]w[redacted]d being seen today for ongoing prenatal care.  She is currently monitored for the following issues for this high-risk pregnancy and has Supervision of high risk pregnancy, antepartum and Obesity in pregnancy, antepartum on their problem list.  ----------------------------------------------------------------------------------- Patient reports no complaints.   Contractions: Irregular. Vag. Bleeding: None.  Movement: Present. Denies leaking of fluid.  ----------------------------------------------------------------------------------- The following portions of the patient's history were reviewed and updated as appropriate: allergies, current medications, past family history, past medical history, past social history, past surgical history and problem list. Problem list updated.   Objective  Blood pressure 128/72, height 5\' 1"  (1.549 m), weight 287 lb (130.2 kg), last menstrual period 10/10/2019. Pregravid weight 267 lb (121.1 kg) Total Weight Gain 20 lb (9.072 kg) Urinalysis:      Fetal Status: Fetal Heart Rate (bpm): 135   Movement: Present     General:  Alert, oriented and cooperative. Patient is in no acute distress.  Skin: Skin is warm and dry. No rash noted.   Cardiovascular: Normal heart rate noted  Respiratory: Normal respiratory effort, no problems with respiration noted  Abdomen: Soft, gravid, appropriate for gestational age. Pain/Pressure: Present     Pelvic:  Cervical exam deferred        Extremities: Normal range of motion.  Edema: None  Mental Status: Normal mood and affect. Normal behavior. Normal judgment and thought content.     Assessment   25 y.o. G2P1001 at [redacted]w[redacted]d by  07/16/2020, by Last Menstrual Period presenting for routine prenatal visit  Plan   pregnancy 2 Problems (from 10/10/19 to present)    Problem Noted Resolved   Supervision of high risk pregnancy, antepartum  12/15/2019 by 12/17/2019, MD No   Overview Addendum 06/07/2020  9:30 AM by 08/07/2020, MD     Nursing Staff Provider  Office Location  [facility] Dating   LMP, confirmed 8 wk Natale Milch  Language  English Anatomy US  [facility]  Flu Vaccine  12/29/19 Genetic Screen  NIPS:   Normal XY   TDaP vaccine   05/10/2020 Hgb A1C or  GTT Early : WNL Third trimester : 132  Rhogam  n/a   LAB RESULTS   Feeding Plan Breast Blood Type O/Positive/-- (09/14 0935)   Contraception n/a Antibody Negative (09/14 0935)  Circumcision no Rubella 1.64 (09/14 0935)  Pediatrician   RPR Non Reactive (09/14 0935)   Support Person  HBsAg Negative (09/14 0935)   Prenatal Classes  discussed HIV Non Reactive (09/14 0935)    Varicella  Immune  BTL Consent No GBS  (For PCN allergy, check sensitivities)   Anes Consult     VBAC Consent  not applicable Pap  2021 NIL    Hgb Electro      CF   Covid Vaccination  discussed, unvaccinated, had COVID July 2020 [facility]         High Risk Diagnoses: Obesity in pregnancy History of Gestational Hypertension- recommended 81 mg daily ASA at 12 weeks       Previous Version   Obesity in pregnancy, antepartum 12/15/2019 by 12/17/2019, MD No   Overview Addendum 05/10/2020  4:52 PM by 07/08/2020, CNM    Starting weight: 267 Starting BMI: 50.60  Weight gain recommendation BMI> 30:  11-20 lbs  [x ] Screen of OSA- negative screen  [x ] Early diabetes screening for BMI>30- ordered  [ ]   Consultation with Anesthesia- referral placed  Antenatal Testing BMI 35-39.9 Weekly at 37 weeks BMI 40 or > Weekly at 34 weeks        Previous Version       NST: 135 bpm baseline, moderate variability, 15x15 accelerations, no decelerations.  Cleared by anesthesia Growth US WNL  Gestational age appropriate obstetric precautions including but not limited to vaginal bleeding, contractions, leaking of fluid and fetal movement were reviewed in detail with the patient.     Return in about 1 week (around 07/04/2020) for ROB in person/ NST.  Natale Milch MD [facility], [facility] 06/27/2020, 4:22 PM

## 2020-06-29 LAB — CERVICOVAGINAL ANCILLARY ONLY
Chlamydia: NEGATIVE
Comment: NEGATIVE
Comment: NORMAL
Neisseria Gonorrhea: NEGATIVE

## 2020-07-01 DIAGNOSIS — Z419 Encounter for procedure for purposes other than remedying health state, unspecified: Secondary | ICD-10-CM | POA: Diagnosis not present

## 2020-07-01 LAB — CULTURE, BETA STREP (GROUP B ONLY): Strep Gp B Culture: NEGATIVE

## 2020-07-04 ENCOUNTER — Ambulatory Visit (INDEPENDENT_AMBULATORY_CARE_PROVIDER_SITE_OTHER): Payer: BLUE CROSS/BLUE SHIELD | Admitting: Obstetrics & Gynecology

## 2020-07-04 ENCOUNTER — Other Ambulatory Visit: Payer: Self-pay

## 2020-07-04 ENCOUNTER — Encounter: Payer: Self-pay | Admitting: Obstetrics & Gynecology

## 2020-07-04 VITALS — BP 122/82 | Wt 286.0 lb

## 2020-07-04 DIAGNOSIS — O99210 Obesity complicating pregnancy, unspecified trimester: Secondary | ICD-10-CM | POA: Diagnosis not present

## 2020-07-04 DIAGNOSIS — O0990 Supervision of high risk pregnancy, unspecified, unspecified trimester: Secondary | ICD-10-CM

## 2020-07-04 DIAGNOSIS — Z3A35 35 weeks gestation of pregnancy: Secondary | ICD-10-CM

## 2020-07-04 LAB — POCT URINALYSIS DIPSTICK OB
Glucose, UA: NEGATIVE
POC,PROTEIN,UA: NEGATIVE

## 2020-07-04 NOTE — Addendum Note (Signed)
Addended by: Cornelius Moras D on: 07/04/2020 10:56 AM   Modules accepted: Orders

## 2020-07-04 NOTE — Patient Instructions (Signed)

## 2020-07-04 NOTE — Progress Notes (Signed)
  Subjective  Fetal Movement? yes Contractions? no Leaking Fluid? no Vaginal Bleeding? no  Objective  BP 122/82   Wt 286 lb (129.7 kg)   LMP 10/10/2019   BMI 54.04 kg/m  General: NAD Pumonary: no increased work of breathing Abdomen: gravid, non-tender Extremities: no edema Psychiatric: mood appropriate, affect full  A NST procedure was performed with FHR monitoring and a normal baseline established, appropriate time of 20-40 minutes of evaluation, and accels >2 seen w 15x15 characteristics.  Results show a REACTIVE NST.   See [place] report    AFI 10.5  Assessment  25 y.o. G2P1001 at [redacted]w[redacted]d by  07/16/2020, by Last Menstrual Period presenting for routine prenatal visit  Plan   Problem List Items Addressed This Visit      Other   Supervision of high risk pregnancy, antepartum - Primary   Obesity in pregnancy, antepartum    Other Visit Diagnoses    [redacted] weeks gestation of pregnancy          pregnancy 2 Problems (from 10/10/19 to present)    Problem Noted Resolved   Supervision of high risk pregnancy, antepartum     Overview Addendum 06/07/2020  9:30 AM by Natale Milch, MD     Nursing Staff Provider  Office Location  [facility] Dating   LMP, confirmed 8 wk [place]  Language  English Anatomy US  [facility]  Flu Vaccine  12/29/19 Genetic Screen  NIPS:   Normal XY   TDaP vaccine   05/10/2020 Hgb A1C or  GTT Early : WNL Third trimester : 132  Rhogam  n/a   LAB RESULTS   Feeding Plan Breast Blood Type O/Positive/-- (09/14 0935)   Contraception n/a Antibody Negative (09/14 0935)  Circumcision no Rubella 1.64 (09/14 0935)  Pediatrician   RPR Non Reactive (09/14 0935)   Support Person  HBsAg Negative (09/14 0935)   Prenatal Classes  discussed HIV Non Reactive (09/14 0935)    Varicella  Immune  BTL Consent No GBS  (For PCN allergy, check sensitivities)   Anes Consult     VBAC Consent  not applicable Pap  2021 NIL    Hgb Electro      CF   Covid Vaccination  discussed, unvaccinated,  had COVID July 2020 [facility]         High Risk Diagnoses: Obesity in pregnancy History of Gestational Hypertension- recommended 81 mg daily ASA at 12 weeks       Previous Version   Obesity in pregnancy, antepartum     Overview Addendum 05/10/2020  4:52 PM by Mirna Mires, CNM    Starting weight: 267 Starting BMI: 50.60  Weight gain recommendation BMI> 30:  11-20 lbs  [x ] Screen of OSA- negative screen  [x ] Early diabetes screening for BMI>30- ordered  [ x] Consultation with Anesthesia  Antenatal Testing BMI 35-39.9 Weekly at 37 weeks BMI 40 or > Weekly at 34 weeks        Previous Version     Discuss IOL nv  APT weekly  PNV,FMC, Labor precautions  Annamarie Major, MD, [facility], [facility] 07/04/2020  10:48 AM

## 2020-07-04 NOTE — Progress Notes (Signed)
ULTRASOUND REPORT  Location: [facility] Date of Service: 07/04/2020   Indications:AFI Findings:  Mason Jim intrauterine pregnancy is visualized with FHR at 120 BPM.  Fetal presentation is Cephalic.  Placenta: anterior. Grade: 1 AFI: 10.5 cm  Impression: 1. [redacted]w[redacted]d Viable Singleton Intrauterine pregnancy dated by previously established criteria. 2. AFI is 10.5 cm.   Recommendations: 1.Clinical correlation with the patient's History and Physical Exam.  Ashley Libra, MD

## 2020-07-11 ENCOUNTER — Other Ambulatory Visit: Payer: Self-pay

## 2020-07-11 ENCOUNTER — Ambulatory Visit (INDEPENDENT_AMBULATORY_CARE_PROVIDER_SITE_OTHER): Payer: BLUE CROSS/BLUE SHIELD | Admitting: Obstetrics and Gynecology

## 2020-07-11 VITALS — BP 122/84 | Wt 286.0 lb

## 2020-07-11 DIAGNOSIS — O99213 Obesity complicating pregnancy, third trimester: Secondary | ICD-10-CM

## 2020-07-11 DIAGNOSIS — O99210 Obesity complicating pregnancy, unspecified trimester: Secondary | ICD-10-CM

## 2020-07-11 DIAGNOSIS — Z3A39 39 weeks gestation of pregnancy: Secondary | ICD-10-CM

## 2020-07-11 DIAGNOSIS — O0990 Supervision of high risk pregnancy, unspecified, unspecified trimester: Secondary | ICD-10-CM

## 2020-07-11 LAB — POCT URINALYSIS DIPSTICK OB
Glucose, UA: NEGATIVE
POC,PROTEIN,UA: NEGATIVE

## 2020-07-11 LAB — FETAL NONSTRESS TEST

## 2020-07-11 NOTE — Progress Notes (Signed)
  [facility] B[facility]THPLACE INDUCTION ASSESSMENT Ashley Austin 05/09/1995 Medical record #: [id number] Phone #:  Home Phone [phone]  Mobile [phone]    Prenatal Provider:[facility] Delivering Group:[facility] Proposed admission date/time:06/15/2020 0500 Method of induction:Cytotec  Weight: Filed Weights04/11/22 1505Weight:286 lb (129.7 kg) BMI Body mass index is 54.04 kg/m. HIV Negative HSV Negative EDC Estimated Date of Delivery: 4/16/22based on:LMP  Gestational age on admission: 40w0e Gravidity/parity:G2P1001  Cervix Score   0 1 2 3   Position Posterior Midposition Anterior   Consistency Firm Medium Soft   Effacement (%) 0-30 40-50 60-70 >80  Dilation (cm) Closed 1-2 3-4 >5  Baby's station -3 -2 -1 +1, +2   Bishop Score:   Medical induction of labor  select indication(s) below Elective induction ?39 weeks multiparous patient ?39 weeks primiparous patient with Bishop score ?7 ?40 weeks primiparous patient   Medical Indications Adapted from ACOG Committee Opinion #[id number], "Medically Indicated Late Preterm and Early Term Deliveries," 2013.  PLACENTAL / UTERINE ISSUES FETAL ISSUES MATERNAL ISSUES  ? Placenta previa (36.0-37.6) ? Isoimmunization (37.0-38.6) ? Preeclampsia without severe features or gestational HTN (37.0)  ? Suspected accreta (34.0-35.6) ? Growth Restriction 05[birth year]24-1984) ? Preeclampsia with severe features (34.0)  ? Prior classical CD, uterine window, rupture (36.0-37.6) ? Isolated (38.0-39.6) ? Chronic HTN (38.0-39.6)  ? Prior myomectomy (37.0-38.6) ? Concurrent findings (34.0-37.6) ? Cholestasis (37.0)  ? Umbilical vein varix (37.0) ? Growth Restriction (Twins) ? Diabetes  ? Placental abruption (chronic) ? Di-Di Isolated (36.0-37.6) ? Pregestational, controlled (39.0)  OBSTETRIC ISSUES ? Di-Di concurrent findings (32.0-34.6) ? Pregestational, uncontrolled (37.0-39.0)  ? Postdates ? (41 weeks) ? Mo-Di isolated (32.0-34.6) ? Pregestational,  vascular compromise (37.0- 39.0)  ? PPROM (34.0) ? Multiple Gestation ? Gestational, diet controlled (40.0)  ? Hx of IUFD (39.0 weeks) ? Di-Di (38.0-38.6) ? Gestational, med controlled (39.0)  ? Polyhydramnios, mild/moderate; SDV 8-16 or AFI 25-35 (39.0) ? Mo-Di (36.0-37.6) ? Gestational, uncontrolled (38.0-39.0)  ? Oligohydramnios (36.0-37.6); MVP <2 cm  For indications not listed above, delivery recommendations from maternal-fetal medicine consultant occurred on: Date:   Provider Signature: 06-19-1986 Scheduled by: Date:07/11/2020 9:29 PM   Call [phone] to finalize the induction date/time  [phone] (07/17)

## 2020-07-11 NOTE — Progress Notes (Signed)
22

## 2020-07-11 NOTE — Progress Notes (Signed)
Obstetric H&P   Chief Complaint: ROB and IOL scheduling  Prenatal Care Provider: [facility]  History of Present Illness: 25 y.o. G2P1001 [redacted]w[redacted]d by 07/16/2020, by Last Menstrual Period presenting for scheduled NST and IOL scheduling.  Pregnancy has been complicated by obesity Body mass index is 54.04 kg/m.   Growth has been appropriate with most recent growth scan 06/15/2020 at 35w4 showing EFW 2677g or 5lbs 14oz c/w 43.8%ile, AC 54.1%ile.  Given normal interval growth EFW at 40 week predicted to be 3400g.  Pelvis tested to 6lbs 4oz or 2835g.  +FM, no LOF, no VB  Pregravid weight 267 lb (121.1 kg) Total Weight Gain 19 lb (8.618 kg)  pregnancy 2 Problems (from 10/10/19 to present)    Problem Noted Resolved   Supervision of high risk pregnancy, antepartum 12/15/2019 by Natale Milch, MD No   Overview Addendum 06/07/2020  9:30 AM by Natale Milch, MD     Nursing Staff Provider  Office Location  [facility] Dating   LMP, confirmed 8 wk [place]  Language  English Anatomy US  [facility]  Flu Vaccine  12/29/19 Genetic Screen  NIPS:   Normal XY   TDaP vaccine   05/10/2020 Hgb A1C or  GTT Early : WNL Third trimester : 132  Rhogam  n/a   LAB RESULTS   Feeding Plan Breast Blood Type O/Positive/-- (09/14 0935)   Contraception n/a Antibody Negative (09/14 0935)  Circumcision no Rubella 1.64 (09/14 0935)  Pediatrician   RPR Non Reactive (09/14 0935)   Support Person  HBsAg Negative (09/14 0935)   Prenatal Classes  discussed HIV Non Reactive (09/14 0935)    Varicella  Immune  BTL Consent No GBS  (For PCN allergy, check sensitivities)   Anes Consult     VBAC Consent  not applicable Pap  2021 NIL    Hgb Electro      CF   Covid Vaccination  discussed, unvaccinated, had COVID July 2020 [facility]         High Risk Diagnoses: Obesity in pregnancy History of Gestational Hypertension- recommended 81 mg daily ASA at 12 weeks       Previous Version   Obesity in pregnancy, antepartum 12/15/2019 by Natale Milch, MD No   Overview Addendum 05/10/2020  4:52 PM by Mirna Mires, CNM    Starting weight: 267 Starting BMI: 50.60  Weight gain recommendation BMI> 30:  11-20 lbs  [x ] Screen of OSA- negative screen  [x ] Early diabetes screening for BMI>30- ordered  [ ]  Consultation with Anesthesia- referral placed  Antenatal Testing BMI 35-39.9 Weekly at 37 weeks BMI 40 or > Weekly at 34 weeks        Previous Version       Review of Systems: 10 point review of systems negative unless otherwise noted in HPI  Past Medical History: Patient Active Problem List   Diagnosis Date Noted  . Supervision of high risk pregnancy, antepartum 12/15/2019     Nursing Staff Provider  Office Location  [facility] Dating   LMP, confirmed 8 wk 12/17/2019  Language  English Anatomy US  [facility]  Flu Vaccine  12/29/19 Genetic Screen  NIPS:   Normal XY   TDaP vaccine   05/10/2020 Hgb A1C or  GTT Early : WNL Third trimester : 132  Rhogam  n/a   LAB RESULTS   Feeding Plan Breast Blood Type O/Positive/-- (09/14 0935)   Contraception n/a Antibody Negative (09/14 0935)  Circumcision no Rubella 1.64 (  09/14 0935)  Pediatrician   RPR Non Reactive (09/14 0935)   Support Person  HBsAg Negative (09/14 0935)   Prenatal Classes  discussed HIV Non Reactive (09/14 0935)    Varicella  Immune  BTL Consent No GBS  (For PCN allergy, check sensitivities)   Anes Consult     VBAC Consent  not applicable Pap  2021 NIL    Hgb Electro      CF   Covid Vaccination  discussed, unvaccinated, had COVID July 2020 [facility]         High Risk Diagnoses: Obesity in pregnancy History of Gestational Hypertension- recommended 81 mg daily ASA at 12 weeks    . Obesity in pregnancy, antepartum 12/15/2019    Starting weight: 267 Starting BMI: 50.60  Weight gain recommendation BMI> 30:  11-20 lbs  [x ] Screen of OSA- negative screen  [x ] Early diabetes screening for BMI>30- ordered  [ ]  Consultation with Anesthesia- referral  placed  Antenatal Testing BMI 35-39.9 Weekly at 37 weeks BMI 40 or > Weekly at 34 weeks       Past Surgical History: No past surgical history on file.  Past Obstetric History: # 1 - Date: 03/11/13, Sex: Female, Weight: 6 lb 4 oz (2.835 kg), GA: 100w0d, Delivery: Vaginal, Spontaneous, Apgar1: None, Apgar5: None, Living: None, Birth Comments: None  # 2 - Date: None, Sex: None, Weight: None, GA: None, Delivery: None, Apgar1: None, Apgar5: None, Living: None, Birth Comments: None   Past Gynecologic History:  Family History: Family History  Problem Relation Age of Onset  . Breast cancer Neg Hx   . Ovarian cancer Neg Hx     Social History: Social History   Socioeconomic History  . Marital status: Single    Spouse name: Not on file  . Number of children: Not on file  . Years of education: Not on file  . Highest education level: Not on file  Occupational History  . Not on file  Tobacco Use  . Smoking status: Never Smoker  . Smokeless tobacco: Never Used  Vaping Use  . Vaping Use: Never used  Substance and Sexual Activity  . Alcohol use: Not Currently  . Drug use: Never  . Sexual activity: Yes    Birth control/protection: None  Other Topics Concern  . Not on file  Social History Narrative  . Not on file   Social Determinants of Health   Financial Resource Strain: Not on file  Food Insecurity: Not on file  Transportation Needs: Not on file  Physical Activity: Not on file  Stress: Not on file  Social Connections: Not on file  Intimate Partner Violence: Not on file    Medications: Prior to Admission medications   Not on File    Allergies: No Known Allergies  Physical Exam: Vitals: Blood pressure 122/84, weight 286 lb (129.7 kg), last menstrual period 10/10/2019.  FHT: 130   General: NAD HEENT: normocephalic, anicteric Pulmonary: No increased work of breathing Cardiovascular: RRR, distal pulses 2+ Abdomen: Gravid, non-tender Extremities: no edema,  erythema, or tenderness Neurologic: Grossly intact Psychiatric: mood appropriate, affect full  Labs: Results for orders placed or performed in visit on 07/11/20 (from the past 24 hour(s))  POC Urinalysis Dipstick OB     Status: Normal   Collection Time: 07/11/20  3:23 PM  Result Value Ref Range   Color, UA     Clarity, UA     Glucose, UA Negative Negative   Bilirubin, UA  Ketones, UA     Spec Grav, UA     Blood, UA     pH, UA     POC,PROTEIN,UA Negative Negative, Trace, Small (1+), Moderate (2+), Large (3+), 4+   Urobilinogen, UA     Nitrite, UA     Leukocytes, UA     Appearance     Odor      Assessment: 25 y.o. G2P1001 [redacted]w[redacted]d by 07/16/2020, by Last Menstrual Period ROB visit and IOL scheduling  Plan: 1) IOL scheduled for 07/16/2020  2) Fetus - reactive NST normal AFI today  3) PNL - Blood type O/Positive/-- (09/14 0935) / Anti-bodyscreen Negative (09/14 0935) / Rubella 1.64 (09/14 0935) / Varicella Non-Immune / RPR Non Reactive (01/20 0936) / HBsAg Negative (09/14 0935) / HIV Non Reactive (01/20 5638) / GBS Negative/-- (03/28 1646)  4) Immunization History -  Immunization History  Administered Date(s) Administered  . Influenza,inj,Quad PF,6+ Mos 06/16/2019, 12/29/2019  . Tdap 05/10/2020    5) Disposition - IOL at [redacted]w[redacted]d  Vena Austria, MD, [facility], [facility]

## 2020-07-11 NOTE — Progress Notes (Signed)
Routine Prenatal Care Visit  Subjective  Ashley Austin is a 25 y.o. G2P1001 at [redacted]w[redacted]d being seen today for ongoing prenatal care.  She is currently monitored for the following issues for this high-risk pregnancy and has Supervision of high risk pregnancy, antepartum and Obesity in pregnancy, antepartum on their problem list.  ----------------------------------------------------------------------------------- Patient reports no complaints.    .  .   . Denies leaking of fluid.  ----------------------------------------------------------------------------------- The following portions of the patient's history were reviewed and updated as appropriate: allergies, current medications, past family history, past medical history, past social history, past surgical history and problem list. Problem list updated.   Objective  Blood pressure 122/84, weight 286 lb (129.7 kg), last menstrual period 10/10/2019. Pregravid weight 267 lb (121.1 kg) Total Weight Gain 19 lb (8.618 kg) Urinalysis:      Fetal Status:           General:  Alert, oriented and cooperative. Patient is in no acute distress.  Skin: Skin is warm and dry. No rash noted.   Cardiovascular: Normal heart rate noted  Respiratory: Normal respiratory effort, no problems with respiration noted  Abdomen: Soft, gravid, appropriate for gestational age.       Pelvic:  Cervical exam deferred        Extremities: Normal range of motion.     ental Status: Normal mood and affect. Normal behavior. Normal judgment and thought content.   Baseline: 130 Variability: moderate Accelerations: present Decelerations: absent Tocometry: N/A The patient was monitored for 30 minutes, fetal heart rate tracing was deemed reactive, category I tracing,  Bedside AFI 10.35cm  Assessment   24 y.o. G2P1001 at [redacted]w[redacted]d by  07/16/2020, by Last Menstrual Period presenting for routine prenatal visit  Plan   pregnancy 2 Problems (from 10/10/19 to present)     Problem Noted Resolved   Supervision of high risk pregnancy, antepartum 12/15/2019 by Natale Milch, MD No   Overview Addendum 06/07/2020  9:30 AM by Natale Milch, MD     Nursing Staff Provider  Office Location  [facility] Dating   LMP, confirmed 8 wk [place]  Language  English Anatomy US  [facility]  Flu Vaccine  12/29/19 Genetic Screen  NIPS:   Normal XY   TDaP vaccine   05/10/2020 Hgb A1C or  GTT Early : WNL Third trimester : 132  Rhogam  n/a   LAB RESULTS   Feeding Plan Breast Blood Type O/Positive/-- (09/14 0935)   Contraception n/a Antibody Negative (09/14 0935)  Circumcision no Rubella 1.64 (09/14 0935)  Pediatrician   RPR Non Reactive (09/14 0935)   Support Person  HBsAg Negative (09/14 0935)   Prenatal Classes  discussed HIV Non Reactive (09/14 0935)    Varicella  Immune  BTL Consent No GBS  (For PCN allergy, check sensitivities)   Anes Consult     VBAC Consent  not applicable Pap  2021 NIL    Hgb Electro      CF   Covid Vaccination  discussed, unvaccinated, had COVID July 2020 [facility]         High Risk Diagnoses: Obesity in pregnancy History of Gestational Hypertension- recommended 81 mg daily ASA at 12 weeks       Previous Version   Obesity in pregnancy, antepartum 12/15/2019 by Natale Milch, MD No   Overview Addendum 05/10/2020  4:52 PM by Mirna Mires, CNM    Starting weight: 267 Starting BMI: 50.60  Weight gain recommendation BMI> 30:  11-20  lbs  [x ] Screen of OSA- negative screen  [x ] Early diabetes screening for BMI>30- ordered  [ ]  Consultation with Anesthesia- referral placed  Antenatal Testing BMI 35-39.9 Weekly at 37 weeks BMI 40 or > Weekly at 34 weeks        Previous Version       Gestational age appropriate obstetric precautions including but not limited to vaginal bleeding, contractions, leaking of fluid and fetal movement were reviewed in detail with the patient.    Was told IOL Saturday checked with L&D and not on book.  Scheduled for 07/16/20 at 0500  Orders placed  No follow-ups on file.  07/18/20, MD, Vena Austria [facility], [facility] 07/11/2020, 3:30 PM

## 2020-07-11 NOTE — Progress Notes (Signed)
1263

## 2020-07-13 ENCOUNTER — Other Ambulatory Visit: Payer: Self-pay | Admitting: Obstetrics & Gynecology

## 2020-07-14 ENCOUNTER — Other Ambulatory Visit
Admission: RE | Admit: 2020-07-14 | Discharge: 2020-07-14 | Disposition: A | Discharge status: Home / Self Care | Payer: BLUE CROSS/BLUE SHIELD | Source: Ambulatory Visit | Attending: Obstetrics and Gynecology | Admitting: Obstetrics and Gynecology

## 2020-07-14 ENCOUNTER — Other Ambulatory Visit: Payer: Self-pay

## 2020-07-14 DIAGNOSIS — Z8616 Personal history of COVID-19: Secondary | ICD-10-CM | POA: Diagnosis not present

## 2020-07-14 DIAGNOSIS — Z01812 Encounter for preprocedural laboratory examination: Secondary | ICD-10-CM | POA: Insufficient documentation

## 2020-07-14 DIAGNOSIS — O99213 Obesity complicating pregnancy, third trimester: Secondary | ICD-10-CM | POA: Diagnosis not present

## 2020-07-14 DIAGNOSIS — Z3A40 40 weeks gestation of pregnancy: Secondary | ICD-10-CM | POA: Diagnosis not present

## 2020-07-14 DIAGNOSIS — Z20822 Contact with and (suspected) exposure to covid-19: Secondary | ICD-10-CM | POA: Insufficient documentation

## 2020-07-14 DIAGNOSIS — O480 Post-term pregnancy: Secondary | ICD-10-CM | POA: Diagnosis not present

## 2020-07-14 DIAGNOSIS — Z23 Encounter for immunization: Secondary | ICD-10-CM | POA: Diagnosis not present

## 2020-07-14 DIAGNOSIS — Z3490 Encounter for supervision of normal pregnancy, unspecified, unspecified trimester: Secondary | ICD-10-CM | POA: Diagnosis not present

## 2020-07-14 DIAGNOSIS — O99214 Obesity complicating childbirth: Secondary | ICD-10-CM | POA: Diagnosis not present

## 2020-07-14 DIAGNOSIS — I10 Essential (primary) hypertension: Secondary | ICD-10-CM | POA: Diagnosis not present

## 2020-07-14 DIAGNOSIS — O165 Unspecified maternal hypertension, complicating the puerperium: Secondary | ICD-10-CM | POA: Diagnosis not present

## 2020-07-14 LAB — SARS CORONAVIRUS 2 (TAT 6-24 HRS): SARS Coronavirus 2: NEGATIVE

## 2020-07-15 ENCOUNTER — Inpatient Hospital Stay
Admission: EM | Admit: 2020-07-15 | Discharge: 2020-07-17 | DRG: 806 | Disposition: A | Discharge status: Home / Self Care | Payer: BLUE CROSS/BLUE SHIELD | Attending: Obstetrics & Gynecology | Admitting: Obstetrics & Gynecology

## 2020-07-15 ENCOUNTER — Encounter: Payer: Self-pay | Admitting: Obstetrics and Gynecology

## 2020-07-15 ENCOUNTER — Inpatient Hospital Stay: Payer: BLUE CROSS/BLUE SHIELD | Admitting: Anesthesiology

## 2020-07-15 DIAGNOSIS — O99213 Obesity complicating pregnancy, third trimester: Secondary | ICD-10-CM | POA: Diagnosis present

## 2020-07-15 DIAGNOSIS — O165 Unspecified maternal hypertension, complicating the puerperium: Secondary | ICD-10-CM | POA: Diagnosis not present

## 2020-07-15 DIAGNOSIS — Z8616 Personal history of COVID-19: Secondary | ICD-10-CM

## 2020-07-15 DIAGNOSIS — Z3490 Encounter for supervision of normal pregnancy, unspecified, unspecified trimester: Secondary | ICD-10-CM | POA: Diagnosis present

## 2020-07-15 DIAGNOSIS — Z23 Encounter for immunization: Secondary | ICD-10-CM

## 2020-07-15 DIAGNOSIS — O99210 Obesity complicating pregnancy, unspecified trimester: Secondary | ICD-10-CM

## 2020-07-15 DIAGNOSIS — Z3A40 40 weeks gestation of pregnancy: Secondary | ICD-10-CM

## 2020-07-15 DIAGNOSIS — O0990 Supervision of high risk pregnancy, unspecified, unspecified trimester: Secondary | ICD-10-CM

## 2020-07-15 DIAGNOSIS — O480 Post-term pregnancy: Secondary | ICD-10-CM | POA: Diagnosis not present

## 2020-07-15 LAB — CBC
HCT: 35 % — ABNORMAL LOW (ref 36.0–46.0)
Hemoglobin: 11.3 g/dL — ABNORMAL LOW (ref 12.0–15.0)
MCH: 24.4 pg — ABNORMAL LOW (ref 26.0–34.0)
MCHC: 32.3 g/dL (ref 30.0–36.0)
MCV: 75.4 fL — ABNORMAL LOW (ref 80.0–100.0)
Platelets: 258 10*3/uL (ref 150–400)
RBC: 4.64 MIL/uL (ref 3.87–5.11)
RDW: 15.3 % (ref 11.5–15.5)
WBC: 9.4 10*3/uL (ref 4.0–10.5)
nRBC: 0 % (ref 0.0–0.2)

## 2020-07-15 LAB — TYPE AND SCREEN
ABO/RH(D): O POS
Antibody Screen: NEGATIVE

## 2020-07-15 LAB — ABO/RH: ABO/RH(D): O POS

## 2020-07-15 LAB — RPR: RPR Ser Ql: NONREACTIVE

## 2020-07-15 MED ORDER — MISOPROSTOL 25 MCG QUARTER TABLET
25.0000 ug | ORAL_TABLET | Freq: Once | ORAL | Status: AC
  Administered 2020-07-15: 25 ug via BUCCAL

## 2020-07-15 MED ORDER — OXYTOCIN 10 UNIT/ML IJ SOLN
INTRAMUSCULAR | Status: AC
  Filled 2020-07-15: qty 2

## 2020-07-15 MED ORDER — OXYTOCIN-SODIUM CHLORIDE 30-0.9 UT/500ML-% IV SOLN
2.5000 [IU]/h | INTRAVENOUS | Status: DC
  Filled 2020-07-15: qty 500

## 2020-07-15 MED ORDER — LIDOCAINE HCL (PF) 1 % IJ SOLN
INTRAMUSCULAR | Status: DC | PRN
  Administered 2020-07-15: 3 mL via INTRADERMAL

## 2020-07-15 MED ORDER — MISOPROSTOL 25 MCG QUARTER TABLET
25.0000 ug | ORAL_TABLET | Freq: Once | ORAL | Status: AC
  Administered 2020-07-15: 25 ug via BUCCAL
  Filled 2020-07-15: qty 1

## 2020-07-15 MED ORDER — MISOPROSTOL 200 MCG PO TABS
ORAL_TABLET | ORAL | Status: AC
  Filled 2020-07-15: qty 4

## 2020-07-15 MED ORDER — ONDANSETRON HCL 4 MG/2ML IJ SOLN: 4.0000 mg | Freq: Four times a day (QID) | INTRAMUSCULAR | Status: DC | PRN

## 2020-07-15 MED ORDER — TERBUTALINE SULFATE 1 MG/ML IJ SOLN: 0.2500 mg | Freq: Once | INTRAMUSCULAR | Status: DC | PRN

## 2020-07-15 MED ORDER — AMMONIA AROMATIC IN INHA
RESPIRATORY_TRACT | Status: AC
  Filled 2020-07-15: qty 10

## 2020-07-15 MED ORDER — OXYTOCIN-SODIUM CHLORIDE 30-0.9 UT/500ML-% IV SOLN
1.0000 m[IU]/min | INTRAVENOUS | Status: DC
  Administered 2020-07-15 – 2020-07-16 (×2): 2 m[IU]/min via INTRAVENOUS
  Filled 2020-07-15: qty 500

## 2020-07-15 MED ORDER — OXYTOCIN BOLUS FROM INFUSION
333.0000 mL | Freq: Once | INTRAVENOUS | Status: AC
  Administered 2020-07-16: 333 mL via INTRAVENOUS

## 2020-07-15 MED ORDER — ACETAMINOPHEN 325 MG PO TABS: 650.0000 mg | ORAL_TABLET | ORAL | Status: DC | PRN

## 2020-07-15 MED ORDER — MISOPROSTOL 25 MCG QUARTER TABLET
25.0000 ug | ORAL_TABLET | ORAL | Status: DC | PRN
  Administered 2020-07-15 (×3): 25 ug via VAGINAL
  Filled 2020-07-15 (×3): qty 1

## 2020-07-15 MED ORDER — LIDOCAINE-EPINEPHRINE (PF) 1.5 %-1:200000 IJ SOLN
INTRAMUSCULAR | Status: DC | PRN
  Administered 2020-07-15: 4 mL via EPIDURAL

## 2020-07-15 MED ORDER — LACTATED RINGERS IV SOLN
500.0000 mL | INTRAVENOUS | Status: DC | PRN
  Administered 2020-07-15: 250 mL via INTRAVENOUS

## 2020-07-15 MED ORDER — EPHEDRINE 5 MG/ML INJ
10.0000 mg | INTRAVENOUS | Status: DC | PRN
  Filled 2020-07-15: qty 2

## 2020-07-15 MED ORDER — DIPHENHYDRAMINE HCL 50 MG/ML IJ SOLN: 12.5000 mg | INTRAMUSCULAR | Status: DC | PRN

## 2020-07-15 MED ORDER — SOD CITRATE-CITRIC ACID 500-334 MG/5ML PO SOLN: 30.0000 mL | ORAL | Status: DC | PRN

## 2020-07-15 MED ORDER — LACTATED RINGERS IV SOLN
500.0000 mL | Freq: Once | INTRAVENOUS | Status: AC
  Administered 2020-07-15: 500 mL via INTRAVENOUS

## 2020-07-15 MED ORDER — PHENYLEPHRINE 40 MCG/ML (10ML) SYRINGE FOR IV PUSH (FOR BLOOD PRESSURE SUPPORT)
80.0000 ug | PREFILLED_SYRINGE | INTRAVENOUS | Status: DC | PRN
  Filled 2020-07-15: qty 10

## 2020-07-15 MED ORDER — FENTANYL 2.5 MCG/ML W/ROPIVACAINE 0.15% IN NS 100 ML EPIDURAL (ARMC)
EPIDURAL | Status: AC
  Filled 2020-07-15: qty 100

## 2020-07-15 MED ORDER — LACTATED RINGERS IV SOLN: INTRAVENOUS | Status: DC

## 2020-07-15 MED ORDER — BUPIVACAINE HCL (PF) 0.25 % IJ SOLN
INTRAMUSCULAR | Status: DC | PRN
  Administered 2020-07-15: 4 mL via EPIDURAL
  Administered 2020-07-15: 5 mL via EPIDURAL

## 2020-07-15 MED ORDER — LIDOCAINE HCL (PF) 1 % IJ SOLN
30.0000 mL | INTRAMUSCULAR | Status: DC | PRN
  Filled 2020-07-15: qty 30

## 2020-07-15 MED ORDER — FENTANYL 2.5 MCG/ML W/ROPIVACAINE 0.15% IN NS 100 ML EPIDURAL (ARMC)
12.0000 mL/h | EPIDURAL | Status: DC
  Administered 2020-07-15: 12 mL/h via EPIDURAL

## 2020-07-15 NOTE — Anesthesia Procedure Notes (Signed)
Epidural Patient location during procedure: OB  Staffing Anesthesiologist: Piscitello, Cleda Mccreedy, MD Performed: anesthesiologist   Preanesthetic Checklist Completed: patient identified, IV checked, site marked, risks and benefits discussed, surgical consent, monitors and equipment checked, pre-op evaluation and timeout performed  Epidural Patient position: sitting Prep: ChloraPrep Patient monitoring: heart rate, continuous pulse ox and blood pressure Approach: midline Location: L4-L5 Injection technique: LOR saline  Needle:  Needle type: Tuohy  Needle gauge: 17 G Needle length: 9 cm and 9 Needle insertion depth: 9 cm Catheter type: closed end flexible Catheter size: 19 Gauge Catheter at skin depth: 16 cm Test dose: negative and 1.5% lidocaine with Epi 1:200 K  Assessment Sensory level: T10 Events: blood not aspirated, injection not painful, no injection resistance, no paresthesia and negative IV test  Additional Notes 1st attempt Pt. Evaluated and documentation done after procedure finished. Patient identified. Risks/Benefits/Options discussed with patient including but not limited to bleeding, infection, nerve damage, paralysis, failed block, incomplete pain control, headache, blood pressure changes, nausea, vomiting, reactions to medication both or allergic, itching and postpartum back pain. Confirmed with bedside nurse the patient's most recent platelet count. Confirmed with patient that they are not currently taking any anticoagulation, have any bleeding history or any family history of bleeding disorders. Patient expressed understanding and wished to proceed. All questions were answered. Sterile technique was used throughout the entire procedure. Please see nursing notes for vital signs. Test dose was given through epidural catheter and negative prior to continuing to dose epidural or start infusion. Warning signs of high block given to the patient including shortness of  breath, tingling/numbness in hands, complete motor block, or any concerning symptoms with instructions to call for help. Patient was given instructions on fall risk and not to get out of bed. All questions and concerns addressed with instructions to call with any issues or inadequate analgesia.   Patient tolerated the insertion well without immediate complications.Reason for block:procedure for pain

## 2020-07-15 NOTE — Progress Notes (Signed)
  Labor Progress Note   25 y.o. G2P1001 @ [redacted]w[redacted]d , admitted for  Pregnancy, Labor Management.   Subjective:  Comfortable w epidural  Objective:  BP 123/89   Pulse 83   Temp 98.4 F (36.9 C) (Axillary)   Resp 18   Ht 5\' 1"  (1.549 m)   Wt 127 kg   LMP 10/10/2019   SpO2 100%   BMI 52.91 kg/m  Abd: gravid, ND, FHT present, mild tenderness on exam Extr: trace to 1+ bilateral pedal edema SVE: CERVIX: 7 cm dilated, 80 effaced, -3 station  EFM: FHR: 130 bpm, variability: moderate,  accelerations:  Present,  decelerations:  Present reg w ctxs, c/w early decels Toco: Frequency: Every 3-5 minutes Labs: I have reviewed the patient's lab results.   Assessment & Plan:  G2P1001 @ [redacted]w[redacted]d, admitted for  Pregnancy and Labor/Delivery Management  1. Pain management: epidural. 2. FWB: FHT category 2.  3. ID: GBS negative 4. Labor management: Cont active mgt of labor IUPC placed, appears SROM may have already occured  All discussed with patient, see orders  [redacted]w[redacted]d, MD, [facility] Major Ob/Gyn, [facility] 07/15/2020  11:28 PM

## 2020-07-15 NOTE — Anesthesia Preprocedure Evaluation (Signed)
Anesthesia Evaluation  Patient identified by MRN, date of birth, ID band Patient awake    Reviewed: Allergy & Precautions, H&P , NPO status , Patient's Chart, lab work & pertinent test results, reviewed documented beta blocker date and time   Airway Mallampati: III  TM Distance: >3 FB Neck ROM: full    Dental no notable dental hx. (+) Teeth Intact   Pulmonary neg pulmonary ROS, Current Smoker,    Pulmonary exam normal breath sounds clear to auscultation       Cardiovascular Exercise Tolerance: Poor hypertension, On Medications negative cardio ROS   Rhythm:regular Rate:Normal     Neuro/Psych negative neurological ROS  negative psych ROS   GI/Hepatic negative GI ROS, Neg liver ROS,   Endo/Other  diabetes, GestationalMorbid obesity  Renal/GU      Musculoskeletal   Abdominal   Peds  Hematology negative hematology ROS (+)   Anesthesia Other Findings   Reproductive/Obstetrics (+) Pregnancy                             Anesthesia Physical Anesthesia Plan  ASA: III  Anesthesia Plan: Epidural   Post-op Pain Management:    Induction:   PONV Risk Score and Plan:   Airway Management Planned:   Additional Equipment:   Intra-op Plan:   Post-operative Plan:   Informed Consent: I have reviewed the patients History and Physical, chart, labs and discussed the procedure including the risks, benefits and alternatives for the proposed anesthesia with the patient or authorized representative who has indicated his/her understanding and acceptance.       Plan Discussed with:   Anesthesia Plan Comments:         Anesthesia Quick Evaluation

## 2020-07-15 NOTE — Progress Notes (Signed)
  Labor Progress Note   25 y.o. G2P1001 @ [redacted]w[redacted]d , admitted for  Pregnancy, Labor Management.   Subjective:  Min pain  Objective:  BP 137/84 (BP Location: Right Arm)   Pulse 93   Temp 98.4 F (36.9 C) (Oral)   Resp 18   Ht 5\' 1"  (1.549 m)   Wt 127 kg   LMP 10/10/2019   BMI 52.91 kg/m  Abd: gravid, ND, FHT present, mild tenderness on exam Extr: trace to 1+ bilateral pedal edema SVE: CERVIX: 2 cm dilated, 70 effaced, -3 station  EFM: FHR: 130 bpm, variability: moderate,  accelerations:  Present,  decelerations:  Absent Toco: irreg pattern Labs: I have reviewed the patient's lab results.   Assessment & Plan:  G2P1001 @ [redacted]w[redacted]d, admitted for  Pregnancy and Labor/Delivery Management  1. Pain management: none. 2. FWB: FHT category 1.  3. ID: GBS negative 4. Labor management: Pitocin now (she has had 3 rounds of Misoprostol) Discussed pros and cons and plans  All discussed with patient, see orders  [redacted]w[redacted]d, MD, Annamarie Major Ob/Gyn, [facility] 07/15/2020  6:36 PM

## 2020-07-15 NOTE — H&P (Signed)
History and Physical Interval Note:  07/15/2020 8:50 AM  Ashley Austin  has presented today for INDUCTION OF LABOR (cervical ripening agents),  with the diagnosis of Favorable cervix at term and pregnancy complicated by maternal morbid obesity. The various methods of treatment have been discussed with the patient and family. After consideration of risks, benefits and other options for treatment, the patient has consented to  Labor induction .  The patient's history has been reviewed, patient examined, no change in status, and is stable for induction as planned.  See H&P. I have reviewed the patient's chart and labs.  Questions were answered to the patient's satisfaction.     Nursing Staff Provider  Office Location  [facility] Dating   LMP, confirmed 8 wk [place]  Language  English Anatomy US  [facility]  Flu Vaccine  12/29/19 Genetic Screen  NIPS:   Normal XY   TDaP vaccine   05/10/2020 Hgb A1C or  GTT Early : WNL Third trimester : 132  Rhogam  n/a   LAB RESULTS   Feeding Plan Breast Blood Type O/Positive/-- (09/14 0935)   Contraception n/a Antibody Negative (09/14 0935)  Circumcision no Rubella 1.64 (09/14 0935)  Pediatrician  - RPR Non Reactive (09/14 0935)   Support Person Husband HBsAg Negative (09/14 0935)   Prenatal Classes  discussed HIV Non Reactive (09/14 0935)    Varicella  Immune  BTL Consent No GBS  NEG  Anes Consult Done    VBAC Consent  not applicable Pap  2021 NIL  Covid Vaccination  discussed, unvaccinated, had COVID July 2020      Annamarie Major, MD, [facility], [facility] 07/15/2020  8:50 AM

## 2020-07-16 ENCOUNTER — Encounter: Payer: Self-pay | Admitting: Obstetrics and Gynecology

## 2020-07-16 DIAGNOSIS — O480 Post-term pregnancy: Secondary | ICD-10-CM | POA: Diagnosis not present

## 2020-07-16 DIAGNOSIS — Z3A40 40 weeks gestation of pregnancy: Secondary | ICD-10-CM

## 2020-07-16 LAB — CBC WITH DIFFERENTIAL/PLATELET
Abs Immature Granulocytes: 0.07 10*3/uL (ref 0.00–0.07)
Basophils Absolute: 0 10*3/uL (ref 0.0–0.1)
Basophils Relative: 0 %
Eosinophils Absolute: 0 10*3/uL (ref 0.0–0.5)
Eosinophils Relative: 0 %
HCT: 32.9 % — ABNORMAL LOW (ref 36.0–46.0)
Hemoglobin: 10.4 g/dL — ABNORMAL LOW (ref 12.0–15.0)
Immature Granulocytes: 1 %
Lymphocytes Relative: 12 %
Lymphs Abs: 1.5 10*3/uL (ref 0.7–4.0)
MCH: 24.2 pg — ABNORMAL LOW (ref 26.0–34.0)
MCHC: 31.6 g/dL (ref 30.0–36.0)
MCV: 76.5 fL — ABNORMAL LOW (ref 80.0–100.0)
Monocytes Absolute: 0.5 10*3/uL (ref 0.1–1.0)
Monocytes Relative: 4 %
Neutro Abs: 10.7 10*3/uL — ABNORMAL HIGH (ref 1.7–7.7)
Neutrophils Relative %: 83 %
Platelets: 226 10*3/uL (ref 150–400)
RBC: 4.3 MIL/uL (ref 3.87–5.11)
RDW: 15.3 % (ref 11.5–15.5)
WBC: 12.7 10*3/uL — ABNORMAL HIGH (ref 4.0–10.5)
nRBC: 0 % (ref 0.0–0.2)

## 2020-07-16 LAB — COMPREHENSIVE METABOLIC PANEL
ALT: 30 U/L (ref 0–44)
AST: 28 U/L (ref 15–41)
Albumin: 2.3 g/dL — ABNORMAL LOW (ref 3.5–5.0)
Alkaline Phosphatase: 111 U/L (ref 38–126)
Anion gap: 7 (ref 5–15)
BUN: 6 mg/dL (ref 6–20)
CO2: 20 mmol/L — ABNORMAL LOW (ref 22–32)
Calcium: 8.2 mg/dL — ABNORMAL LOW (ref 8.9–10.3)
Chloride: 106 mmol/L (ref 98–111)
Creatinine, Ser: 0.54 mg/dL (ref 0.44–1.00)
GFR, Estimated: >60 mL/min (ref >60)
Glucose, Bld: 138 mg/dL — ABNORMAL HIGH (ref 70–99)
Potassium: 3.6 mmol/L (ref 3.5–5.1)
Sodium: 133 mmol/L — ABNORMAL LOW (ref 135–145)
Total Bilirubin: 0.5 mg/dL (ref 0.3–1.2)
Total Protein: 5.4 g/dL — ABNORMAL LOW (ref 6.5–8.1)

## 2020-07-16 LAB — CBC
HCT: 33.2 % — ABNORMAL LOW (ref 36.0–46.0)
Hemoglobin: 11 g/dL — ABNORMAL LOW (ref 12.0–15.0)
MCH: 24.9 pg — ABNORMAL LOW (ref 26.0–34.0)
MCHC: 33.1 g/dL (ref 30.0–36.0)
MCV: 75.3 fL — ABNORMAL LOW (ref 80.0–100.0)
Platelets: 244 10*3/uL (ref 150–400)
RBC: 4.41 MIL/uL (ref 3.87–5.11)
RDW: 15.4 % (ref 11.5–15.5)
WBC: 15.3 10*3/uL — ABNORMAL HIGH (ref 4.0–10.5)
nRBC: 0 % (ref 0.0–0.2)

## 2020-07-16 MED ORDER — VARICELLA VIRUS VACCINE LIVE 1350 PFU/0.5ML IJ SUSR
0.5000 mL | Freq: Once | INTRAMUSCULAR | Status: AC
  Administered 2020-07-17: 0.5 mL via SUBCUTANEOUS
  Filled 2020-07-16 (×2): qty 0.5

## 2020-07-16 MED ORDER — ACETAMINOPHEN 325 MG PO TABS: 650.0000 mg | ORAL_TABLET | ORAL | Status: DC | PRN

## 2020-07-16 MED ORDER — DIPHENHYDRAMINE HCL 25 MG PO CAPS: 25.0000 mg | ORAL_CAPSULE | Freq: Four times a day (QID) | ORAL | Status: DC | PRN

## 2020-07-16 MED ORDER — NIFEDIPINE 10 MG PO CAPS
30.0000 mg | ORAL_CAPSULE | Freq: Every day | ORAL | Status: DC
  Administered 2020-07-16 – 2020-07-17 (×2): 30 mg via ORAL
  Filled 2020-07-16 (×3): qty 3

## 2020-07-16 MED ORDER — ONDANSETRON HCL 4 MG PO TABS: 4.0000 mg | ORAL_TABLET | ORAL | Status: DC | PRN

## 2020-07-16 MED ORDER — IBUPROFEN 600 MG PO TABS
600.0000 mg | ORAL_TABLET | Freq: Four times a day (QID) | ORAL | Status: DC
  Administered 2020-07-16 – 2020-07-17 (×5): 600 mg via ORAL
  Filled 2020-07-16 (×5): qty 1

## 2020-07-16 MED ORDER — BENZOCAINE-MENTHOL 20-0.5 % EX AERO: 1.0000 "application " | INHALATION_SPRAY | CUTANEOUS | Status: DC | PRN

## 2020-07-16 MED ORDER — COCONUT OIL OIL: 1.0000 "application " | TOPICAL_OIL | Status: DC | PRN

## 2020-07-16 MED ORDER — ONDANSETRON HCL 4 MG/2ML IJ SOLN: 4.0000 mg | INTRAMUSCULAR | Status: DC | PRN

## 2020-07-16 MED ORDER — WITCH HAZEL-GLYCERIN EX PADS
1.0000 | MEDICATED_PAD | CUTANEOUS | Status: DC | PRN
Start: 2020-07-16 — End: 2020-07-17

## 2020-07-16 MED ORDER — SIMETHICONE 80 MG PO CHEW: 80.0000 mg | CHEWABLE_TABLET | ORAL | Status: DC | PRN

## 2020-07-16 MED ORDER — SENNOSIDES-DOCUSATE SODIUM 8.6-50 MG PO TABS: 2.0000 | ORAL_TABLET | ORAL | Status: DC

## 2020-07-16 MED ORDER — SODIUM CHLORIDE 0.9% FLUSH: 3.0000 mL | Freq: Two times a day (BID) | INTRAVENOUS | Status: DC

## 2020-07-16 MED ORDER — VARICELLA VIRUS VACCINE LIVE 1350 PFU/0.5ML IJ SUSR
0.5000 mL | Freq: Once | INTRAMUSCULAR | Status: DC
  Filled 2020-07-16: qty 0.5

## 2020-07-16 MED ORDER — DIBUCAINE (PERIANAL) 1 % EX OINT: 1.0000 "application " | TOPICAL_OINTMENT | CUTANEOUS | Status: DC | PRN

## 2020-07-16 MED ORDER — SENNOSIDES-DOCUSATE SODIUM 8.6-50 MG PO TABS
2.0000 | ORAL_TABLET | ORAL | Status: DC
  Administered 2020-07-16 – 2020-07-17 (×2): 2 via ORAL
  Filled 2020-07-16 (×2): qty 2

## 2020-07-16 MED ORDER — SODIUM CHLORIDE 0.9 % IV SOLN: 250.0000 mL | INTRAVENOUS | Status: DC | PRN

## 2020-07-16 MED ORDER — OXYCODONE-ACETAMINOPHEN 5-325 MG PO TABS: 2.0000 | ORAL_TABLET | ORAL | Status: DC | PRN

## 2020-07-16 MED ORDER — OXYCODONE-ACETAMINOPHEN 5-325 MG PO TABS: 1.0000 | ORAL_TABLET | ORAL | Status: DC | PRN

## 2020-07-16 MED ORDER — ZOLPIDEM TARTRATE 5 MG PO TABS: 5.0000 mg | ORAL_TABLET | Freq: Every evening | ORAL | Status: DC | PRN

## 2020-07-16 MED ORDER — SODIUM CHLORIDE 0.9% FLUSH: 3.0000 mL | INTRAVENOUS | Status: DC | PRN

## 2020-07-16 NOTE — Discharge Instructions (Signed)

## 2020-07-16 NOTE — Progress Notes (Signed)
Post Partum Day 0 Subjective: no complaints, voiding, tolerating PO and and she denies headachem visual changes  Objective: Blood pressure (!) 147/95, pulse 75, temperature 97.6 F (36.4 C), temperature source Oral, resp. rate 20, height 5\' 1"  (1.549 m), weight 127 kg, last menstrual period 10/10/2019, SpO2 99 %, unknown if currently breastfeeding.  Physical Exam:  General: alert, cooperative, fatigued, no distress and morbidly obese Lochia: appropriate Uterine Fundus: firm Incision: healing well, no significant drainage DVT Evaluation: No evidence of DVT seen on physical exam. Negative Homan's sign.  Recent Labs    07/15/20 0623 07/16/20 0616  HGB 11.3* 11.0*  HCT 35.0* 33.2*    Assessment/Plan: Plan for discharge tomorrow  Some PP HTN noted- pre eclampsia labs ordered. May start Procardia 30 mg po  Support breastfeeding.    LOS: 1 day   07/18/20 07/16/2020, 10:19 AM

## 2020-07-16 NOTE — Discharge Summary (Signed)
Postpartum Discharge Summary  Date of Service updated 07/17/2020     Patient Name: Ashley Austin DOB: [birth year] MRN: [id number]  Date of admission: 07/15/2020 Delivery date:07/16/2020  Delivering provider: Gae Dry  Date of discharge: 07/17/2020  Admitting diagnosis: Encounter for elective induction of labor [Z34.90] Intrauterine pregnancy: [redacted]w[redacted]d     Secondary diagnosis:  Active Problems:   Encounter for elective induction of labor   Normal labor and delivery   Postpartum care following vaginal delivery  Additional problems: postpartum hypertension    Discharge diagnosis: Term Pregnancy Delivered                                              Post partum procedures:none Augmentation: AROM, Pitocin and Cytotec Complications: None  Hospital course: Induction of Labor With Vaginal Delivery   25 y.o. yo G2P1001 at [redacted]w[redacted]d was admitted to the hospital 07/15/2020 for induction of labor.  Indication for induction: Favorable cervix at term.  Patient had an uncomplicated labor course as follows: Membrane Rupture Time/Date: 11:22 PM ,07/15/2020   Delivery Method:Vaginal, Spontaneous  Episiotomy: None  Lacerations:  1st degree  Details of delivery can be found in separate delivery note.  Patient had a routine postpartum course. Patient is discharged home 07/17/20.  Newborn Data: Birth date:07/16/2020  Birth time:2:13 AM  Gender:Female  Living status:Living  Apgars:8 ,9  Weight:3110 g   Magnesium Sulfate received: No BMZ received: No Rhophylac:No MMR:No T-DaP:Given prenatally Flu: Yes Transfusion:No  Physical exam  Vitals:   07/16/20 1914 07/16/20 2317 07/17/20 0258 07/17/20 0753  BP: 135/77 138/80 128/73 (!) 146/86  Pulse: 91 81 83 81  Resp: $Remo'18 18 18 20  'bdNhA$ Temp: 98 F (36.7 C) 98.7 F (37.1 C) 97.8 F (36.6 C) 97.8 F (36.6 C)  TempSrc: Oral Oral Oral Oral  SpO2: 100% 100% 100% 100%  Weight:      Height:       General: alert, cooperative and no  distress Lochia: appropriate Uterine Fundus: firm Incision: Healing well with no significant drainage DVT Evaluation: No evidence of DVT seen on physical exam. Negative Homan's sign. Labs: Lab Results  Component Value Date   WBC 12.7 (H) 07/16/2020   HGB 10.4 (L) 07/16/2020   HCT 32.9 (L) 07/16/2020   MCV 76.5 (L) 07/16/2020   PLT 226 07/16/2020   CMP Latest Ref Rng & Units 07/16/2020  Glucose 70 - 99 mg/dL 138(H)  BUN 6 - 20 mg/dL 6  Creatinine 0.44 - 1.00 mg/dL 0.54  Sodium 135 - 145 mmol/L 133(L)  Potassium 3.5 - 5.1 mmol/L 3.6  Chloride 98 - 111 mmol/L 106  CO2 22 - 32 mmol/L 20(L)  Calcium 8.9 - 10.3 mg/dL 8.2(L)  Total Protein 6.5 - 8.1 g/dL 5.4(L)  Total Bilirubin 0.3 - 1.2 mg/dL 0.5  Alkaline Phos 38 - 126 U/L 111  AST 15 - 41 U/L 28  ALT 0 - 44 U/L 30   Edinburgh Score: Edinburgh Postnatal Depression Scale Screening Tool 07/16/2020  I have been able to laugh and see the funny side of things. 0  I have looked forward with enjoyment to things. 0  I have blamed myself unnecessarily when things went wrong. 1  I have been anxious or worried for no good reason. 2  I have felt scared or panicky for no good reason. 1  Things have been getting  on top of me. 0  I have been so unhappy that I have had difficulty sleeping. 0  I have felt sad or miserable. 0  I have been so unhappy that I have been crying. 0  The thought of harming myself has occurred to me. 0  Edinburgh Postnatal Depression Scale Total 4      After visit meds:  Allergies as of 07/17/2020   No Known Allergies     Medication List    TAKE these medications   ibuprofen 600 MG tablet Commonly known as: ADVIL Take 1 tablet (600 mg total) by mouth every 6 (six) hours.   NIFEdipine 10 MG capsule Commonly known as: PROCARDIA Take 3 capsules (30 mg total) by mouth daily. Start taking on: July 18, 2020        Discharge home in stable condition Infant Feeding: Bottle Infant Disposition:home with  mother Discharge instruction: per After Visit Summary and Postpartum booklet. Activity: Advance as tolerated. Pelvic rest for 6 weeks.  Diet: routine diet Anticipated Birth Control: Unsure Postpartum Appointment:6 weeks Additional Postpartum F/U: BP check 1 week Future Appointments:No future appointments. Follow up Visit:  Follow-up Information    Gae Dry, MD. Schedule an appointment as soon as possible for a visit in 1 week(s).   Specialty: Obstetrics and Gynecology Why: Please call the office and report to [facility] for a blood pressure check in one week. In addition, make a second appointment for 6 weeks post delivery for a physical and to choose birth control Contact information: [address] [place] [address] [phone]                   07/17/2020 Imagene Riches, CNM

## 2020-07-17 MED ORDER — IBUPROFEN 600 MG PO TABS: 600.0000 mg | ORAL_TABLET | Freq: Four times a day (QID) | ORAL | 0 refills | Status: DC

## 2020-07-17 MED ORDER — NIFEDIPINE 10 MG PO CAPS: 30.0000 mg | ORAL_CAPSULE | Freq: Every day | ORAL | 1 refills | Status: DC

## 2020-07-17 NOTE — Anesthesia Postprocedure Evaluation (Signed)
Anesthesia Post Note  Patient: Ashley Austin  Procedure(s) Performed: AN AD HOC LABOR EPIDURAL  Patient location during evaluation: Mother Baby Anesthesia Type: Epidural Level of consciousness: awake and alert Pain management: pain level controlled Vital Signs Assessment: post-procedure vital signs reviewed and stable Respiratory status: spontaneous breathing, nonlabored ventilation and respiratory function stable Cardiovascular status: stable Postop Assessment: no headache, no backache and epidural receding Anesthetic complications: no   No complications documented.   Last Vitals:  Vitals:   07/17/20 0258 07/17/20 0753  BP: 128/73 (!) 146/86  Pulse: 83 81  Resp: 18 20  Temp: 36.6 C 36.6 C  SpO2: 100% 100%    Last Pain:  Vitals:   07/17/20 0830  TempSrc:   PainSc: 2                  Corinda Gubler

## 2020-07-17 NOTE — Progress Notes (Signed)
Pt discharged with infant.  Discharge instructions, prescriptions and follow up appointment given to and reviewed with pt. Pt verbalized understanding. Escorted out by auxillary. 

## 2020-07-18 ENCOUNTER — Telehealth: Payer: Self-pay | Admitting: *Deleted

## 2020-07-18 NOTE — Telephone Encounter (Signed)
Transition Care Management Unsuccessful Follow-up Telephone Call  Date of discharge and from where:  07/17/2020 [facility]   Attempts:  1st Attempt  Reason for unsuccessful TCM follow-up call:  Left voice message

## 2020-07-19 NOTE — Telephone Encounter (Signed)
Please advise since Ashley Austin is out of the office.

## 2020-07-19 NOTE — Telephone Encounter (Signed)
Transition Care Management Follow-up Telephone Call  Date of discharge and from where: 07/17/2020 [facility]   How have you been since you were released from the hospital? "I am fine"  Any questions or concerns? No  Items Reviewed:  Did the pt receive and understand the discharge instructions provided? Yes   Medications obtained and verified? Yes   Other? No   Any new allergies since your discharge? No   Dietary orders reviewed? No  Do you have support at home? Yes     Functional Questionnaire: (I = Independent and D = Dependent) ADLs: I  Bathing/Dressing- I  Meal Prep- I  Eating- I  Maintaining continence- I  Transferring/Ambulation- I  Managing Meds- I  Follow up appointments reviewed:   PCP Hospital f/u appt confirmed? No    Specialist Hospital f/u appt confirmed? No    Are transportation arrangements needed? No   If their condition worsens, is the pt aware to call PCP or go to the Emergency Dept.? Yes  Was the patient provided with contact information for the PCP's office or ED? Yes  Was to pt encouraged to call back with questions or concerns? Yes

## 2020-07-20 ENCOUNTER — Observation Stay
Admission: AD | Admit: 2020-07-20 | Discharge: 2020-07-21 | Disposition: A | Discharge status: Home / Self Care | Payer: BLUE CROSS/BLUE SHIELD | Source: Ambulatory Visit | Attending: Obstetrics & Gynecology | Admitting: Obstetrics & Gynecology

## 2020-07-20 ENCOUNTER — Other Ambulatory Visit: Payer: Self-pay

## 2020-07-20 ENCOUNTER — Encounter: Payer: Self-pay | Admitting: Obstetrics and Gynecology

## 2020-07-20 ENCOUNTER — Ambulatory Visit (INDEPENDENT_AMBULATORY_CARE_PROVIDER_SITE_OTHER): Payer: BLUE CROSS/BLUE SHIELD | Admitting: Obstetrics and Gynecology

## 2020-07-20 VITALS — BP 150/80 | Ht 61.0 in | Wt 266.2 lb

## 2020-07-20 DIAGNOSIS — O1495 Unspecified pre-eclampsia, complicating the puerperium: Secondary | ICD-10-CM | POA: Diagnosis not present

## 2020-07-20 DIAGNOSIS — O165 Unspecified maternal hypertension, complicating the puerperium: Secondary | ICD-10-CM

## 2020-07-20 DIAGNOSIS — R875 Abnormal microbiological findings in specimens from female genital organs: Secondary | ICD-10-CM

## 2020-07-20 DIAGNOSIS — I973 Postprocedural hypertension: Secondary | ICD-10-CM | POA: Diagnosis not present

## 2020-07-20 DIAGNOSIS — N760 Acute vaginitis: Secondary | ICD-10-CM

## 2020-07-20 DIAGNOSIS — B9689 Other specified bacterial agents as the cause of diseases classified elsewhere: Secondary | ICD-10-CM

## 2020-07-20 DIAGNOSIS — Z79899 Other long term (current) drug therapy: Secondary | ICD-10-CM | POA: Insufficient documentation

## 2020-07-20 DIAGNOSIS — O152 Eclampsia in the puerperium: Principal | ICD-10-CM | POA: Insufficient documentation

## 2020-07-20 HISTORY — DX: Unspecified maternal hypertension, complicating the puerperium: O16.5

## 2020-07-20 LAB — COMPREHENSIVE METABOLIC PANEL
ALT: 49 U/L — ABNORMAL HIGH (ref 0–44)
AST: 35 U/L (ref 15–41)
Albumin: 3.2 g/dL — ABNORMAL LOW (ref 3.5–5.0)
Alkaline Phosphatase: 119 U/L (ref 38–126)
Anion gap: 10 (ref 5–15)
BUN: <5 mg/dL — ABNORMAL LOW (ref 6–20)
CO2: 23 mmol/L (ref 22–32)
Calcium: 9 mg/dL (ref 8.9–10.3)
Chloride: 104 mmol/L (ref 98–111)
Creatinine, Ser: 0.47 mg/dL (ref 0.44–1.00)
GFR, Estimated: >60 mL/min (ref >60)
Glucose, Bld: 97 mg/dL (ref 70–99)
Potassium: 3.7 mmol/L (ref 3.5–5.1)
Sodium: 137 mmol/L (ref 135–145)
Total Bilirubin: 0.7 mg/dL (ref 0.3–1.2)
Total Protein: 7.5 g/dL (ref 6.5–8.1)

## 2020-07-20 LAB — POCT URINALYSIS DIPSTICK
Bilirubin, UA: NEGATIVE
Blood, UA: POSITIVE
Glucose, UA: NEGATIVE
Nitrite, UA: NEGATIVE
Protein, UA: POSITIVE — AB
Spec Grav, UA: >=1.03 — AB (ref 1.010–1.025)
Urobilinogen, UA: 2 E.U./dL — AB
pH, UA: 7 (ref 5.0–8.0)

## 2020-07-20 LAB — BRAIN NATRIURETIC PEPTIDE: B Natriuretic Peptide: 62.4 pg/mL (ref 0.0–100.0)

## 2020-07-20 LAB — PROTEIN / CREATININE RATIO, URINE
Creatinine, Urine: 12 mg/dL
Protein Creatinine Ratio: 3.42 mg/mg{Cre} — ABNORMAL HIGH (ref 0.00–0.15)
Total Protein, Urine: 41 mg/dL

## 2020-07-20 LAB — CBC
HCT: 36 % (ref 36.0–46.0)
Hemoglobin: 11.5 g/dL — ABNORMAL LOW (ref 12.0–15.0)
MCH: 24.7 pg — ABNORMAL LOW (ref 26.0–34.0)
MCHC: 31.9 g/dL (ref 30.0–36.0)
MCV: 77.4 fL — ABNORMAL LOW (ref 80.0–100.0)
Platelets: 282 10*3/uL (ref 150–400)
RBC: 4.65 MIL/uL (ref 3.87–5.11)
RDW: 15.5 % (ref 11.5–15.5)
WBC: 9.2 10*3/uL (ref 4.0–10.5)
nRBC: 0 % (ref 0.0–0.2)

## 2020-07-20 MED ORDER — LABETALOL HCL 5 MG/ML IV SOLN: 80.0000 mg | INTRAVENOUS | Status: DC | PRN

## 2020-07-20 MED ORDER — LABETALOL HCL 5 MG/ML IV SOLN
40.0000 mg | INTRAVENOUS | Status: DC | PRN
Start: 2020-07-20 — End: 2020-07-21

## 2020-07-20 MED ORDER — HYDRALAZINE HCL 20 MG/ML IJ SOLN: 10.0000 mg | INTRAMUSCULAR | Status: DC | PRN

## 2020-07-20 MED ORDER — CALCIUM GLUCONATE 10 % IV SOLN
INTRAVENOUS | Status: AC
  Filled 2020-07-20: qty 10

## 2020-07-20 MED ORDER — LABETALOL HCL 100 MG PO TABS
200.0000 mg | ORAL_TABLET | Freq: Three times a day (TID) | ORAL | Status: DC
  Administered 2020-07-20 – 2020-07-21 (×4): 200 mg via ORAL
  Filled 2020-07-20: qty 2
  Filled 2020-07-20: qty 1
  Filled 2020-07-20 (×2): qty 2

## 2020-07-20 MED ORDER — MAGNESIUM SULFATE BOLUS VIA INFUSION
4.0000 g | Freq: Once | INTRAVENOUS | Status: AC
  Administered 2020-07-20: 4 g via INTRAVENOUS
  Filled 2020-07-20: qty 1000

## 2020-07-20 MED ORDER — LABETALOL HCL 5 MG/ML IV SOLN
20.0000 mg | INTRAVENOUS | Status: DC | PRN
Start: 2020-07-20 — End: 2020-07-21

## 2020-07-20 MED ORDER — MAGNESIUM SULFATE 40 GM/1000ML IV SOLN
2.0000 g/h | INTRAVENOUS | Status: DC
  Administered 2020-07-21 (×2): 2 g/h via INTRAVENOUS
  Filled 2020-07-20 (×2): qty 1000

## 2020-07-20 MED ORDER — LABETALOL HCL 5 MG/ML IV SOLN: 40.0000 mg | INTRAVENOUS | Status: DC | PRN

## 2020-07-20 MED ORDER — LACTATED RINGERS IV SOLN: INTRAVENOUS | Status: DC

## 2020-07-20 MED ORDER — LABETALOL HCL 5 MG/ML IV SOLN: 20.0000 mg | INTRAVENOUS | Status: DC | PRN

## 2020-07-20 MED ORDER — METRONIDAZOLE 0.75 % VA GEL: 1.0000 | Freq: Every day | VAGINAL | 0 refills | Status: DC

## 2020-07-20 NOTE — Progress Notes (Signed)
Pt transferred over to L&D for magnesium therapy for elevated BP and elevated P/C ratio. Upon initial assessment, Pt's lungs clear. Pt BP was 140/82 and cycling q hr. Pt has generalized non-pitting edema, +2 reflexes and no clonus. Pt denies headache, blurry vision, or epigastric pain. Pt has scant Rubra lochia. RN to educate patient on magnesium and why we give it. Pt verbalizes understanding and denies having any questions. RN to initiate 4g mag bolus. Pt tolerated well. Continuous 2g mag/hr infusing now. Scheduled labetalol given at this time also. RN oriented patient to the room and call bell. Pt reports being comfortable with all needs met at this time. Will continue to monitor.

## 2020-07-20 NOTE — Progress Notes (Signed)
Patient ID: Ashley Austin, female   DOB: [birth year], 25 y.o.   MRN: [id number]  Reason for Consult: Follow-up (B/P follow up)   Referred by Alan Mulder, MD  Subjective:     HPI:  Ashley Austin is a 25 y.o. female patient presents today with complaints of feeling woozy.  She had a vaginal delivery on 07/16/2020.  She had a few elevated blood pressures postpartum and was started on nifedipine 30 mg.  She reports that this medication has been making her feel flushed and like she is having a racing heartbeat for about an hour after she takes the medicine.  She decreased the dose to 10 mg in the morning and 10 mg at night.  She still feels this way after taking the medication.  She denies any headaches or changes of vision.  She reports that she last took her nifedipine this morning.  She has noticed a small amount of increased swelling in her hands and feet.  She reports that her infant is doing well at home.  She has breast and bottle feeding.   Past Medical History:  Diagnosis Date  . Gestational hypertension    Family History  Problem Relation Age of Onset  . Breast cancer Neg Hx   . Ovarian cancer Neg Hx    Past Surgical History:  Procedure Laterality Date  . NO PAST SURGERIES      Short Social History:  Social History   Tobacco Use  . Smoking status: Never Smoker  . Smokeless tobacco: Never Used  Substance Use Topics  . Alcohol use: Not Currently    No Known Allergies  Current Outpatient Medications  Medication Sig Dispense Refill  . ibuprofen (ADVIL) 600 MG tablet Take 1 tablet (600 mg total) by mouth every 6 (six) hours. 30 tablet 0  . metroNIDAZOLE (METROGEL) 0.75 % vaginal gel Place 1 Applicatorful vaginally at bedtime. Apply one applicatorful to vagina at bedtime for 5 days 70 g 0  . NIFEdipine (PROCARDIA) 10 MG capsule Take 3 capsules (30 mg total) by mouth daily. 30 capsule 1   No current facility-administered medications for this visit.    Review of  Systems  Constitutional: Negative for chills, fatigue, fever and unexpected weight change.  HENT: Negative for trouble swallowing.  Eyes: Negative for loss of vision.  Respiratory: Negative for cough, shortness of breath and wheezing.  Cardiovascular: Negative for chest pain, leg swelling, palpitations and syncope.  GI: Negative for abdominal pain, blood in stool, diarrhea, nausea and vomiting.  GU: Negative for difficulty urinating, dysuria, frequency and hematuria.  Musculoskeletal: Negative for back pain, leg pain and joint pain.  Skin: Negative for rash.  Neurological: Negative for dizziness, headaches, light-headedness, numbness and seizures.  Psychiatric: Negative for behavioral problem, confusion, depressed mood and sleep disturbance.        Objective:  Objective   Vitals:   07/20/20 0921  BP: (!) 150/80  Weight: 266 lb 3.2 oz (120.7 kg)  Height: 5\' 1"  (1.549 m)   Body mass index is 50.3 kg/m.  Physical Exam Vitals and nursing note reviewed. Exam conducted with a chaperone present.  Constitutional:      Appearance: Normal appearance. She is well-developed.  HENT:     Head: Normocephalic and atraumatic.  Eyes:     Extraocular Movements: Extraocular movements intact.     Pupils: Pupils are equal, round, and reactive to light.  Cardiovascular:     Rate and Rhythm: Regular rhythm. Tachycardia present.  Pulmonary:  Effort: Pulmonary effort is normal. No respiratory distress.     Breath sounds: Normal breath sounds.  Abdominal:     General: Abdomen is flat.     Palpations: Abdomen is soft.  Musculoskeletal:        General: No signs of injury.  Skin:    General: Skin is warm and dry.  Neurological:     Mental Status: She is alert and oriented to person, place, and time.     Deep Tendon Reflexes: Reflexes abnormal.     Comments: Hyperreflexia with clonus noted bilaterally  Psychiatric:        Behavior: Behavior normal.        Thought Content: Thought content  normal.        Judgment: Judgment normal.     Assessment/Plan:    25 year old with elevated blood pressures and postpartum hypertension versus postpartum preeclampsia. I will direct admit patient to the postpartum for further evaluation of possible postpartum preeclampsia.  Patient needs to be transition from nifedipine to an alternative medication such as labetalol which she may tolerate better.  If needed for severe blood pressures or lab abnormalities patient will be started on IV magnesium. Labs and orders placed.  Nurse midwife on call notified.  Postpartum floor notified of plan.    Adelene Idler MD [facility], [facility] 07/20/2020 10:12 AM

## 2020-07-20 NOTE — H&P (Addendum)
OB History & Physical   History of Present Illness:  Chief Complaint: elevated blood pressure in clinic, not tolerating nifedipine postpartum  HPI:  Ashley Austin is a 25 y.o. [phone] female 4 days postpartum with elevated blood pressure. She reported feeling flushed and having a racing heartbeat after taking her dose of Nifedipine at home. She was seen in the office this morning for blood pressure check which was elevated 140s over 90s. She denies headaches, visual changes, RUQ pain. She was direct admit to postpartum per Dr Jerene Pitch for further evaluation- BPs, labs, possible Magnesium Sulfate. She is no longer breastfeeding.  Patient was subsequently transferred to L&D for administration of magnesium sulfate due to elevated blood pressure and elevated P/C ratio.   Maternal Medical History:   Past Medical History:  Diagnosis Date  . Gestational hypertension     Past Surgical History:  Procedure Laterality Date  . NO PAST SURGERIES      No Known Allergies  Prior to Admission medications   Medication Sig Start Date End Date Taking? Authorizing Provider  ibuprofen (ADVIL) 600 MG tablet Take 1 tablet (600 mg total) by mouth every 6 (six) hours. 07/17/20  Yes Mirna Mires, CNM  NIFEdipine (PROCARDIA) 10 MG capsule Take 3 capsules (30 mg total) by mouth daily. 07/18/20  Yes Mirna Mires, CNM  metroNIDAZOLE (METROGEL) 0.75 % vaginal gel Place 1 Applicatorful vaginally at bedtime. Apply one applicatorful to vagina at bedtime for 5 days 07/20/20   Natale Milch, MD    OB History  Gravida Para Term Preterm AB Living  2 2 2     2   SAB IAB Ectopic Multiple Live Births        0 2    # Outcome Date GA Lbr Len/2nd Weight Sex Delivery Anes PTL Lv  2 Term 07/16/20 [redacted]w[redacted]d / 00:11 3110 g M Vag-Spont EPI  LIV  1 Term 03/11/13 [redacted]w[redacted]d  2835 g M Vag-Spont        Social History: She  reports that she has never smoked. She has never used smokeless tobacco. She reports previous  alcohol use. She reports that she does not use drugs.  Family History: family history is not on file.   Review of Systems:  Review of Systems  Constitutional: Negative for chills and fever.  HENT: Negative for congestion, ear discharge, ear pain, hearing loss, sinus pain and sore throat.   Eyes: Negative for blurred vision and double vision.  Respiratory: Negative for cough, shortness of breath and wheezing.   Cardiovascular: Negative for chest pain, palpitations and leg swelling.  Gastrointestinal: Negative for abdominal pain, blood in stool, constipation, diarrhea, heartburn, melena, nausea and vomiting.  Genitourinary: Negative for dysuria, flank pain, frequency, hematuria and urgency.  Musculoskeletal: Negative for back pain, joint pain and myalgias.  Skin: Negative for itching and rash.  Neurological: Negative for dizziness, tingling, tremors, sensory change, speech change, focal weakness, seizures, loss of consciousness, weakness and headaches.  Endo/Heme/Allergies: Negative for environmental allergies. Does not bruise/bleed easily.  Psychiatric/Behavioral: Negative for depression, hallucinations, memory loss, substance abuse and suicidal ideas. The patient is not nervous/anxious and does not have insomnia.      Physical Exam:  BP 130/69   Pulse 90   Temp 98.7 F (37.1 C) (Oral)   Resp 18   Ht 5\' 1"  (1.549 m)   Wt 120.7 kg   SpO2 99%   BMI 50.30 kg/m   Constitutional: Well nourished, well developed female in no acute distress.  HEENT: normal Skin: Warm and dry.  Cardiovascular: Regular rate and rhythm.   Extremity: trace edema lower extremities  Respiratory: Clear to auscultation bilateral. Normal respiratory effort Neuro: DTRs 2+, Cranial nerves grossly intact Psych: Alert and Oriented x3. No memory deficits. Normal mood and affect.   Results for Ashley Austin (MRN [id number]) as of 07/20/2020 18:13  Ref. Range 07/20/2020 09:57 07/20/2020 10:57 07/20/2020 12:20 07/20/2020  14:57  COMPREHENSIVE METABOLIC PANEL Unknown  Rpt (A)    Sodium Latest Ref Range: 135 - 145 mmol/L  137    Potassium Latest Ref Range: 3.5 - 5.1 mmol/L  3.7    Chloride Latest Ref Range: 98 - 111 mmol/L  104    CO2 Latest Ref Range: 22 - 32 mmol/L  23    Glucose Latest Ref Range: 70 - 99 mg/dL  97    BUN Latest Ref Range: 6 - 20 mg/dL  <5 (L)    Creatinine Latest Ref Range: 0.44 - 1.00 mg/dL  5.00    Calcium Latest Ref Range: 8.9 - 10.3 mg/dL  9.0    Anion gap Latest Ref Range: 5 - 15   10    Alkaline Phosphatase Latest Ref Range: 38 - 126 U/L  119    Albumin Latest Ref Range: 3.5 - 5.0 g/dL  3.2 (L)    AST Latest Ref Range: 15 - 41 U/L  35    ALT Latest Ref Range: 0 - 44 U/L  49 (H)    Total Protein Latest Ref Range: 6.5 - 8.1 g/dL  7.5    Total Bilirubin Latest Ref Range: 0.3 - 1.2 mg/dL  0.7    GFR, Estimated Latest Ref Range: >60 mL/min  >60    B Natriuretic Peptide Latest Ref Range: 0.0 - 100.0 pg/mL  62.4    WBC Latest Ref Range: 4.0 - 10.5 K/uL  9.2    RBC Latest Ref Range: 3.87 - 5.11 MIL/uL  4.65    Hemoglobin Latest Ref Range: 12.0 - 15.0 g/dL  93.8 (L)    HCT Latest Ref Range: 36.0 - 46.0 %  36.0    MCV Latest Ref Range: 80.0 - 100.0 fL  77.4 (L)    MCH Latest Ref Range: 26.0 - 34.0 pg  24.7 (L)    MCHC Latest Ref Range: 30.0 - 36.0 g/dL  18.2    RDW Latest Ref Range: 11.5 - 15.5 %  15.5    Platelets Latest Ref Range: 150 - 400 K/uL  282    nRBC Latest Ref Range: 0.0 - 0.2 %  0.0    Bilirubin, UA Unknown negative     Glucose Latest Ref Range: Negative  Negative     Ketones, UA Unknown trace     Leukocytes,UA Latest Ref Range: Negative  4+ (A)     Nitrite, UA Unknown negative     pH, UA Latest Ref Range: 5.0 - 8.0  7.0     Protein,UA Latest Ref Range: Negative  Positive (A)     Specific Gravity, UA Latest Ref Range: 1.010 - 1.025  >=1.030 (A)     Urobilinogen, UA Latest Ref Range: 0.2 or 1.0 E.U./dL 2.0 (A)     RBC, UA Unknown positive     Total Protein, Urine Latest  Units: mg/dL   41   Protein Creatinine Ratio Latest Ref Range: 0.00 - 0.15 mg/mgCre   3.42 (H)   Creatinine, Urine Latest Units: mg/dL   12   EKG [id number] Unknown  Normal sinus rhythm    Lab Results  Component Value Date   SARSCOV2NAA NEGATIVE 07/14/2020   Total time spent in evaluation and management of patient: 40 minutes  Assessment:  Ashley Austin is a 25 y.o. G62P2002 female with postpartum preeclampsia- elevated blood pressure and P/C ratio   Plan:  1. Admit to Labor & Delivery  2. Preeclampsia: 24 hours IV magnesium sulfate for seizure protection 3. Labetalol IV protocol as needed for severe range BPs 4. Regular diet 5. Labetalol PO 200 mg TID 6. Disposition: discharge to home after Mg SO4/BP regulation   Tresea Mall, CNM 07/20/2020 6:04 PM

## 2020-07-20 NOTE — Progress Notes (Signed)
Patient transferred to Birthplace LDR 1 via wheelchair, report given to Jacqlyn Larsen, RN

## 2020-07-21 DIAGNOSIS — Z79899 Other long term (current) drug therapy: Secondary | ICD-10-CM | POA: Diagnosis not present

## 2020-07-21 DIAGNOSIS — O152 Eclampsia in the puerperium: Secondary | ICD-10-CM | POA: Diagnosis not present

## 2020-07-21 DIAGNOSIS — O1495 Unspecified pre-eclampsia, complicating the puerperium: Secondary | ICD-10-CM | POA: Diagnosis not present

## 2020-07-21 MED ORDER — LABETALOL HCL 200 MG PO TABS: 200.0000 mg | ORAL_TABLET | Freq: Two times a day (BID) | ORAL | 1 refills | Status: DC

## 2020-07-21 MED ORDER — LABETALOL HCL 100 MG PO TABS: 200.0000 mg | ORAL_TABLET | Freq: Two times a day (BID) | ORAL | Status: DC

## 2020-07-21 NOTE — Discharge Instructions (Signed)
Postpartum Hypertension Postpartum hypertension is high blood pressure that is higher than normal after childbirth. It usually starts within 1 to 2 days after delivery, but it can happen at any time for up to 6 weeks after delivery. For some women, medical treatment is required to prevent serious complications, such as seizures or stroke. What are the causes? The cause of this condition is not well understood. In some cases, the cause may not be known. Certain conditions may increase your risk. These include:  Hypertension that existed before pregnancy (chronic hypertension).  Hypertension that comes as a result of pregnancy (gestational hypertension).  Hypertensive disorders during pregnancy or seizures in women who have high blood pressure during pregnancy. These conditions are called preeclampsia and eclampsia.  A condition in which the liver, platelets, and red blood cells are damaged during pregnancy (HELLP syndrome).  Obesity.  Diabetes. What are the signs or symptoms? As with all types of hypertension, postpartum hypertension may not have any symptoms. Depending on how high your blood pressure is, you may experience:  Headaches. These may be mild, moderate, or severe. They may also be steady, constant, or sudden in onset (thunderclap headache).  Vision changes, such as blurry vision, flashing lights, or seeing spots.  Nausea and vomiting.  Pain in the upper right side of your abdomen.  Shortness of breath.  Difficulty breathing while lying down.  A decrease in the amount of urine that you pass. How is this diagnosed? This condition may be diagnosed based on the results of a physical exam, blood pressure measurements, and blood and urine tests. You may also have other tests, such as a CT scan or an MRI, to check for other problems of postpartum hypertension. How is this treated? If blood pressure is high enough to require treatment, your options may include:  Medicines to  reduce blood pressure (antihypertensives). Tell your health care provider if you are breastfeeding or if you plan to breastfeed. There are many antihypertensive medicines that are safe to take while breastfeeding.  Treating medical conditions that are causing hypertension.  Treating the complications of hypertension, such as seizures, stroke, or kidney problems. Your health care provider will also continue to monitor your blood pressure closely until it is within a safe range for you. Follow these instructions at home: Learn your goal blood pressure Two numbers make up your blood pressure. The first number is called systolic pressure. The second is called diastolic pressure. An example of a blood pressure reading is "120 over 80" (or 120/80). For most people, goal blood pressure is:  First number: below 140.  Second number: below 90. Your blood pressure is above normal even if only the top or bottom number is above normal. Know what to do before you take your blood pressure 30 minutes before you check your blood pressure:  Do not drink caffeine.  Do not drink alcohol.  Avoid food and drink.  Do not smoke.  Do not exercise. 5 minutes before you check your blood pressure:  Use the bathroom and urinate so that you have an empty bladder.  Sit quietly in a dining room chair. Do not sit in a soft couch or an armchair. Do not talk. Know how to take your blood pressure To check your blood pressure, follow the instructions in the manual that came with your blood pressure monitor. If you have a digital blood pressure monitor, the instructions may be as follows: 1. Sit up straight. 2. Place your feet on the floor. Do   not cross your ankles or legs. 3. Rest your left arm at the level of your heart. You may rest it on a table, desk, or chair. 4. Pull up your shirt sleeve. 5. Wrap the blood pressure cuff around the upper part of your left arm. The cuff should be 1 inch (2.5 cm) above your  elbow. It is best to wrap the cuff around bare skin. 6. Fit the cuff snugly around your arm. You should be able to place only one finger between the cuff and your arm. 7. Put the cord inside the groove of your elbow. 8. Press the power button. 9. Sit quietly while the cuff fills with air and loses air. 10. Write down the numbers on the screen. These are your blood pressure readings. 11. Wait 1-2 minutes and then repeat steps 1-10.   Record your blood pressure readings Follow your health care provider's instructions on how to record your blood pressure readings. If you were asked to use this form, follow these instructions:  Get one reading in the morning (a.m.) before you take any medicines.  Get one reading in the evening (p.m.) before supper.  Take at least 2 readings with each blood pressure check. This makes sure the results are correct. Wait 1-2 minutes between measurements.  Write down the results in the spaces on this form. Date: _______________________  a.m. _____________________(1st reading) _____________________(2nd reading)  p.m. _____________________(1st reading) _____________________(2nd reading) Date: _______________________  a.m. _____________________(1st reading) _____________________(2nd reading)  p.m. _____________________(1st reading) _____________________(2nd reading) Date: _______________________  a.m. _____________________(1st reading) _____________________(2nd reading)  p.m. _____________________(1st reading) _____________________(2nd reading) Date: _______________________  a.m. _____________________(1st reading) _____________________(2nd reading)  p.m. _____________________(1st reading) _____________________(2nd reading) Date: _______________________  a.m. _____________________(1st reading) _____________________(2nd reading)  p.m. _____________________(1st reading) _____________________(2nd reading) General instructions  Take over-the-counter and  prescription medicines only as told by your health care provider.  Do not use any products that contain nicotine or tobacco. These products include cigarettes, chewing tobacco, and vaping devices, such as e-cigarettes. If you need help quitting, ask your health care provider.  Check your blood pressure as often as recommended by your health care provider.  Return to your normal activities as told by your health care provider. Ask your health care provider what activities are safe for you.  Keep all follow-up visits. This is important. Contact a health care provider if:  You have new symptoms, such as: ? A headache that does not get better. ? Dizziness. ? Visual changes. ? Nausea and vomiting. Get help right away if:  You develop difficulty breathing.  You have chest pain.  You faint.  You have any symptoms of a stroke. "BE FAST" is an easy way to remember the main warning signs of a stroke: ? B - Balance. Signs are dizziness, sudden trouble walking, or loss of balance. ? E - Eyes. Signs are trouble seeing or a sudden change in vision. ? F - Face. Signs are sudden weakness or numbness of the face, or the face or eyelid drooping on one side. ? A - Arms. Signs are weakness or numbness in an arm. This happens suddenly and usually on one side of the body. ? S - Speech. Signs are sudden trouble speaking, slurred speech, or trouble understanding what people say. ? T - Time. Time to call emergency services. Write down what time symptoms started.  You have other signs of a stroke, such as: ? A sudden, severe headache with no known cause. ? Nausea or vomiting. ? Seizure. These symptoms   may represent a serious problem that is an emergency. Do not wait to see if the symptoms will go away. Get medical help right away. Call your local emergency services (911 in the [place] Do not drive yourself to the hospital. Summary  Postpartum hypertension is high blood pressure that remains higher than  normal after childbirth.  For some women, medical treatment is required to prevent serious complications, such as seizures or stroke.  Follow your health care provider's instructions on how to record your blood pressure readings.  Keep all follow-up visits. This is important. This information is not intended to replace advice given to you by your health care provider. Make sure you discuss any questions you have with your health care provider. Document Revised: 12/15/2019 Document Reviewed: 12/15/2019 Elsevier Patient Education  2021 [employer]  

## 2020-07-21 NOTE — Progress Notes (Signed)
MD at bedside at 1600 to discuss plans for discharge. RN at bedside to discuss discharge instructions, medications, follow-up care, and blood pressure education. Patient and support person verbalized understanding and all questions answered at this time. Vital signs stable. Patient discharged with support person.  Karlyn Agee

## 2020-07-21 NOTE — Discharge Summary (Addendum)
Physician Discharge Summary  Patient ID: Ashley Austin MRN: [id number] DOB/AGE: 1995-12-06 25 y.o.  Admit date: 07/20/2020 Discharge date: 07/21/2020  Admission Diagnoses:  Discharge Diagnoses:  Principal Problem:   Preeclampsia in postpartum period Active Problems:   Postpartum hypertension   Discharged Condition: good  Hospital Course: Pt seen and examined, noted to have elevated blood pressures and intolerence rot outpatient therapy w Procardia.  She had no headache, blurry vision, CP, or SOB, but did have elevated proteinuria. She was treated w 24 hours of magnesium IV as well as po Labetalol for HTN management.  She has felt well and BP in 130-140/80-90 range.  Consults: None  Significant Diagnostic Studies: labs:  Results for orders placed or performed during the hospital encounter of 07/20/20  Comprehensive metabolic panel  Result Value Ref Range   Sodium 137 135 - 145 mmol/L   Potassium 3.7 3.5 - 5.1 mmol/L   Chloride 104 98 - 111 mmol/L   CO2 23 22 - 32 mmol/L   Glucose, Bld 97 70 - 99 mg/dL   BUN <5 (L) 6 - 20 mg/dL   Creatinine, Ser 5.36 0.44 - 1.00 mg/dL   Calcium 9.0 8.9 - 46.8 mg/dL   Total Protein 7.5 6.5 - 8.1 g/dL   Albumin 3.2 (L) 3.5 - 5.0 g/dL   AST 35 15 - 41 U/L   ALT 49 (H) 0 - 44 U/L   Alkaline Phosphatase 119 38 - 126 U/L   Total Bilirubin 0.7 0.3 - 1.2 mg/dL   GFR, Estimated >03 >21 mL/min   Anion gap 10 5 - 15  CBC  Result Value Ref Range   WBC 9.2 4.0 - 10.5 K/uL   RBC 4.65 3.87 - 5.11 MIL/uL   Hemoglobin 11.5 (L) 12.0 - 15.0 g/dL   HCT 22.4 82.5 - 00.3 %   MCV 77.4 (L) 80.0 - 100.0 fL   MCH 24.7 (L) 26.0 - 34.0 pg   MCHC 31.9 30.0 - 36.0 g/dL   RDW 70.4 88.8 - 91.6 %   Platelets 282 150 - 400 K/uL   nRBC 0.0 0.0 - 0.2 %  Protein / creatinine ratio, urine  Result Value Ref Range   Creatinine, Urine 12 mg/dL   Total Protein, Urine 41 mg/dL   Protein Creatinine Ratio 3.42 (H) 0.00 - 0.15 mg/mg[Cre]  Brain natriuretic peptide  Result  Value Ref Range   B Natriuretic Peptide 62.4 0.0 - 100.0 pg/mL     Treatments: IV Magnesium  Discharge Exam: Blood pressure (!) 144/92, pulse 86, temperature 97.9 F (36.6 C), temperature source Oral, resp. rate 16, height 5\' 1"  (1.549 m), weight 120.7 kg, SpO2 100 %, unknown if currently breastfeeding. General appearance: alert, cooperative and no distress Resp: clear to auscultation bilaterally Cardio: regular rate and rhythm, S1, S2 normal, no murmur, click, rub or gallop GI: soft, non-tender; bowel sounds normal; no masses,  no organomegaly Extremities: extremities normal, atraumatic, no cyanosis or edema and / or mild edema of lower extermities Pulses: 2+ and symmetric Skin: Skin color, texture, turgor normal. No rashes or lesions Neurologic: Grossly normal No clonus  Disposition: Discharge disposition: 01-Home or Self Care       Discharge Instructions    Call MD for:  difficulty breathing, headache or visual disturbances   Complete by: As directed    Call MD for:  persistant dizziness or light-headedness   Complete by: As directed    Diet - low sodium heart healthy   Complete by: As directed  Allergies as of 07/21/2020   No Known Allergies     Medication List    STOP taking these medications   NIFEdipine 10 MG capsule Commonly known as: PROCARDIA     TAKE these medications   ibuprofen 600 MG tablet Commonly known as: ADVIL Take 1 tablet (600 mg total) by mouth every 6 (six) hours.   labetalol 200 MG tablet Commonly known as: NORMODYNE Take 1 tablet (200 mg total) by mouth 2 (two) times daily.   metroNIDAZOLE 0.75 % vaginal gel Commonly known as: METROGEL Place 1 Applicatorful vaginally at bedtime. Apply one applicatorful to vagina at bedtime for 5 days       Follow-up Information    [facility], [place]. Go in 1 week(s).  07/26/2020 at 0820 am   Contact information: [address] [place] [address] [phone]              A total of 35 minutes were spent face-to-face with the patient as well as preparation, review, communication, and documentation during this encounter. Counseled on continued postpartum care and surveillance of hypertension and its consequences; outpatient follow up discussed as well.   Signed: Letitia Libra 07/21/2020, 4:03 PM

## 2020-07-22 ENCOUNTER — Telehealth: Payer: Self-pay | Admitting: *Deleted

## 2020-07-22 NOTE — Telephone Encounter (Signed)
Transition Care Management Follow-up Telephone Call  Date of discharge and from where: 07/21/2020 [facility]  How have you been since you were released from the hospital? "Fine"  Any questions or concerns? No  Items Reviewed:  Did the pt receive and understand the discharge instructions provided? Yes   Medications obtained and verified? Yes   Other? No   Any new allergies since your discharge? No   Dietary orders reviewed? No  Do you have support at home? Yes    Functional Questionnaire: (I = Independent and D = Dependent) ADLs: I  Bathing/Dressing- I  Meal Prep- I  Eating- I  Maintaining continence- I  Transferring/Ambulation- I  Managing Meds- I  Follow up appointments reviewed:   PCP Hospital f/u appt confirmed? No    Specialist Hospital f/u appt confirmed? Yes  Scheduled to see OBGYN on 07/26/2020 @ 0850 and 08/25/2020 @ 0920.  Are transportation arrangements needed? No   If their condition worsens, is the pt aware to call PCP or go to the Emergency Dept.? Yes  Was the patient provided with contact information for the PCP's office or ED? Yes  Was to pt encouraged to call back with questions or concerns? Yes

## 2020-07-26 ENCOUNTER — Ambulatory Visit (INDEPENDENT_AMBULATORY_CARE_PROVIDER_SITE_OTHER): Payer: BLUE CROSS/BLUE SHIELD | Admitting: Advanced Practice Midwife

## 2020-07-26 ENCOUNTER — Other Ambulatory Visit: Payer: Self-pay

## 2020-07-26 ENCOUNTER — Encounter: Payer: Self-pay | Admitting: Advanced Practice Midwife

## 2020-07-26 VITALS — BP 138/92 | Ht 61.0 in | Wt 258.0 lb

## 2020-07-26 DIAGNOSIS — O99345 Other mental disorders complicating the puerperium: Secondary | ICD-10-CM

## 2020-07-26 DIAGNOSIS — F53 Postpartum depression: Secondary | ICD-10-CM

## 2020-07-26 MED ORDER — PAROXETINE HCL 10 MG PO TABS: 10.0000 mg | ORAL_TABLET | Freq: Every day | ORAL | 11 refills | Status: DC

## 2020-07-26 NOTE — Progress Notes (Signed)
Obstetrics & Gynecology Office Visit   Chief Complaint:  Chief Complaint  Patient presents with  . Follow-up    BP check, no concerns. RM 5    History of Present Illness: [name] being seen for follow up blood pressure check today.  The patient is 2 weeks postpartumThe established diagnosis for the patient is postpartum preeclampsia.  She is currently on labetalol 200 mg BID.  She reports no current symptoms attributable to her blood pressure.  Medication list reviewed medications which may contribute to BP elevation were not noted.  She was taking Paroxetine/Paxil prior to her pregnancy and would like to restart. She does try to take a nap during the day and reports a good support system around her.    Review of Systems: Review of Systems  Constitutional: Negative for chills and fever.  HENT: Negative for congestion, ear discharge, ear pain, hearing loss, sinus pain and sore throat.   Eyes: Negative for blurred vision and double vision.  Respiratory: Negative for cough, shortness of breath and wheezing.   Cardiovascular: Negative for chest pain, palpitations and leg swelling.  Gastrointestinal: Negative for abdominal pain, blood in stool, constipation, diarrhea, heartburn, melena, nausea and vomiting.  Genitourinary: Negative for dysuria, flank pain, frequency, hematuria and urgency.  Musculoskeletal: Negative for back pain, joint pain and myalgias.  Skin: Negative for itching and rash.  Neurological: Negative for dizziness, tingling, tremors, sensory change, speech change, focal weakness, seizures, loss of consciousness, weakness and headaches.  Endo/Heme/Allergies: Negative for environmental allergies. Does not bruise/bleed easily.  Psychiatric/Behavioral: Positive for depression. Negative for hallucinations, memory loss, substance abuse and suicidal ideas. The patient is not nervous/anxious and does not have insomnia.      Past Medical History:  Past Medical  History:  Diagnosis Date  . Gestational hypertension     Past Surgical History:  Past Surgical History:  Procedure Laterality Date  . NO PAST SURGERIES      Gynecologic History: No LMP recorded.  Obstetric History: [id number]  Family History:  Family History  Problem Relation Age of Onset  . Breast cancer Neg Hx   . Ovarian cancer Neg Hx     Social History:  Social History   Socioeconomic History  . Marital status: Single    Spouse name: Not on file  . Number of children: Not on file  . Years of education: Not on file  . Highest education level: Not on file  Occupational History  . Not on file  Tobacco Use  . Smoking status: Never Smoker  . Smokeless tobacco: Never Used  Vaping Use  . Vaping Use: Never used  Substance and Sexual Activity  . Alcohol use: Not Currently  . Drug use: Never  . Sexual activity: Yes    Birth control/protection: Pill  Other Topics Concern  . Not on file  Social History Narrative  . Not on file   Social Determinants of Health   Financial Resource Strain: Not on file  Food Insecurity: Not on file  Transportation Needs: Not on file  Physical Activity: Not on file  Stress: Not on file  Social Connections: Not on file  Intimate Partner Violence: Not on file    Allergies:  No Known Allergies  Medications: Prior to Admission medications   Medication Sig Start Date End Date Taking? Authorizing Provider  ibuprofen (ADVIL) 600 MG tablet Take 1 tablet (600 mg total) by mouth every 6 (six) hours. 07/17/20   Mirna Mires, CNM  labetalol (NORMODYNE) 200 MG tablet Take 1 tablet (200 mg total) by mouth 2 (two) times daily. 07/21/20   Nadara Mustard, MD  metroNIDAZOLE (METROGEL) 0.75 % vaginal gel Place 1 Applicatorful vaginally at bedtime. Apply one applicatorful to vagina at bedtime for 5 days 07/20/20   Natale Milch, MD  PARoxetine (PAXIL) 10 MG tablet Take 1 tablet (10 mg total) by mouth daily. 07/26/20  Yes Tresea Mall, CNM     Physical Exam Blood pressure (!) 138/92, height 5\' 1"  (1.549 m), weight 258 lb (117 kg)  No LMP recorded.  General: NAD HEENT: normocephalic, anicteric Pulmonary: No increased work of breathing Cardiovascular: RRR, distal pulses 2+ Extremities: noedema, no erythema, no tenderness Neurologic: Grossly intact Psychiatric: mood appropriate, affect full   Edinburgh Postnatal Depression Scale - 07/26/20 0834      Edinburgh Postnatal Depression Scale:  In the Past 7 Days   I have been able to laugh and see the funny side of things. 1    I have looked forward with enjoyment to things. 1    I have blamed myself unnecessarily when things went wrong. 0    I have been anxious or worried for no good reason. 3    I have felt scared or panicky for no good reason. 2    Things have been getting on top of me. 2    I have been so unhappy that I have had difficulty sleeping. 0    I have felt sad or miserable. 2    I have been so unhappy that I have been crying. 2    The thought of harming myself has occurred to me. 0    Edinburgh Postnatal Depression Scale Total 13            Assessment: 25 y.o. 21 presenting for blood pressure evaluation today  Plan: Problem List Items Addressed This Visit   None   Visit Diagnoses    Postpartum depression    -  Primary   Relevant Medications   PARoxetine (PAXIL) 10 MG tablet      1) Blood pressure - blood pressure at today's visit is mildly elevated.  As a result patient will continue on current dose of anti-hypertensive. - additional blood work was not obtained  2) Restart Paxil 10 mg daily 3) Return to clinic in 2 weeks for BP check or as needed for worsening symptoms   [id number], CNM [facility] 07/26/2020, 8:52 AM

## 2020-07-31 DIAGNOSIS — Z419 Encounter for procedure for purposes other than remedying health state, unspecified: Secondary | ICD-10-CM | POA: Diagnosis not present

## 2020-08-09 ENCOUNTER — Ambulatory Visit (INDEPENDENT_AMBULATORY_CARE_PROVIDER_SITE_OTHER): Payer: BLUE CROSS/BLUE SHIELD | Admitting: Advanced Practice Midwife

## 2020-08-09 ENCOUNTER — Encounter: Payer: Self-pay | Admitting: Advanced Practice Midwife

## 2020-08-09 ENCOUNTER — Other Ambulatory Visit: Payer: Self-pay

## 2020-08-09 VITALS — BP 138/88 | Ht 61.0 in | Wt 260.0 lb

## 2020-08-09 DIAGNOSIS — Z0130 Encounter for examination of blood pressure without abnormal findings: Secondary | ICD-10-CM

## 2020-08-09 NOTE — Progress Notes (Signed)
Obstetrics & Gynecology Office Visit   Chief Complaint:  Chief Complaint  Patient presents with  . Post-op Follow-up    BP check - no concerns. RM 4    History of Present Illness: 25 y.o. [id number] being seen for follow up blood pressure check today.  The patient is 3 weeks postpartum.The established diagnosis for the patient is postpartum preeclampsia.  She is currently on labetalol 200 mg BID. She reports no current symptoms attributable to her blood pressure.  Medication list reviewed medications which may contribute to BP elevation were not noted.  She started taking Paxil again and reports it is helping.  Review of Systems:  Review of Systems  Constitutional: Negative for chills and fever.  HENT: Negative for congestion, ear discharge, ear pain, hearing loss, sinus pain and sore throat.   Eyes: Negative for blurred vision and double vision.  Respiratory: Negative for cough, shortness of breath and wheezing.   Cardiovascular: Negative for chest pain, palpitations and leg swelling.  Gastrointestinal: Negative for abdominal pain, blood in stool, constipation, diarrhea, heartburn, melena, nausea and vomiting.  Genitourinary: Negative for dysuria, flank pain, frequency, hematuria and urgency.  Musculoskeletal: Negative for back pain, joint pain and myalgias.  Skin: Negative for itching and rash.  Neurological: Negative for dizziness, tingling, tremors, sensory change, speech change, focal weakness, seizures, loss of consciousness, weakness and headaches.  Endo/Heme/Allergies: Negative for environmental allergies. Does not bruise/bleed easily.  Psychiatric/Behavioral: Negative for depression, hallucinations, memory loss, substance abuse and suicidal ideas. The patient is not nervous/anxious and does not have insomnia.      Past Medical History:  Past Medical History:  Diagnosis Date  . Gestational hypertension     Past Surgical History:  Past Surgical History:  Procedure  Laterality Date  . NO PAST SURGERIES      Gynecologic History: No LMP recorded.  Obstetric History: [id number]  Family History:  Family History  Problem Relation Age of Onset  . Breast cancer Neg Hx   . Ovarian cancer Neg Hx     Social History:  Social History   Socioeconomic History  . Marital status: Single    Spouse name: Not on file  . Number of children: Not on file  . Years of education: Not on file  . Highest education level: Not on file  Occupational History  . Not on file  Tobacco Use  . Smoking status: Never Smoker  . Smokeless tobacco: Never Used  Vaping Use  . Vaping Use: Never used  Substance and Sexual Activity  . Alcohol use: Not Currently  . Drug use: Never  . Sexual activity: Yes    Birth control/protection: Pill  Other Topics Concern  . Not on file  Social History Narrative  . Not on file   Social Determinants of Health   Financial Resource Strain: Not on file  Food Insecurity: Not on file  Transportation Needs: Not on file  Physical Activity: Not on file  Stress: Not on file  Social Connections: Not on file  Intimate Partner Violence: Not on file    Allergies:  No Known Allergies  Medications: Prior to Admission medications   Medication Sig Start Date End Date Taking? Authorizing Provider  labetalol (NORMODYNE) 200 MG tablet Take 1 tablet (200 mg total) by mouth 2 (two) times daily. 07/21/20  Yes Nadara Mustard, MD  metroNIDAZOLE (METROGEL) 0.75 % vaginal gel Place 1 Applicatorful vaginally at bedtime. Apply one applicatorful to vagina at bedtime for 5 days 07/20/20  Yes Schuman, Christanna R, MD  PARoxetine (PAXIL) 10 MG tablet Take 1 tablet (10 mg total) by mouth daily. 07/26/20  Yes Tresea Mall, CNM  ibuprofen (ADVIL) 600 MG tablet Take 1 tablet (600 mg total) by mouth every 6 (six) hours. 07/17/20   Mirna Mires, CNM    Physical Exam Blood pressure 138/88, height 5\' 1"  (1.549 m), weight 260 lb (117.9 kg)  No LMP  recorded.  General: NAD HEENT: normocephalic, anicteric Pulmonary: No increased work of breathing Cardiovascular: RRR, distal pulses 2+ Extremities: noedema, no erythema, no tenderness Neurologic: Grossly intact Psychiatric: mood appropriate, affect full   Edinburgh Postnatal Depression Scale - 08/09/20 0822      Edinburgh Postnatal Depression Scale:  In the Past 7 Days   I have been able to laugh and see the funny side of things. 1    I have looked forward with enjoyment to things. 1    I have blamed myself unnecessarily when things went wrong. 0    I have been anxious or worried for no good reason. 2    I have felt scared or panicky for no good reason. 0    Things have been getting on top of me. 0    I have been so unhappy that I have had difficulty sleeping. 0    I have felt sad or miserable. 1    I have been so unhappy that I have been crying. 1    The thought of harming myself has occurred to me. 0    Edinburgh Postnatal Depression Scale Total 6            Assessment: 25 y.o. 22 presenting for blood pressure evaluation today  Plan: Problem List Items Addressed This Visit   None     1) Blood pressure - blood pressure at today's visit is mildly elevated.  As a result she will continue on current dose of anti-hypertensive. - additional blood work was not obtained  2) Precautions reviewed 3) Return in 3 weeks for 6 week visit   [id number], CNM [facility] 08/09/2020, 8:31 AM

## 2020-08-12 ENCOUNTER — Other Ambulatory Visit: Payer: Self-pay | Admitting: Obstetrics & Gynecology

## 2020-08-17 ENCOUNTER — Other Ambulatory Visit: Payer: Self-pay | Admitting: Advanced Practice Midwife

## 2020-08-17 DIAGNOSIS — O99345 Other mental disorders complicating the puerperium: Secondary | ICD-10-CM

## 2020-08-17 DIAGNOSIS — F53 Postpartum depression: Secondary | ICD-10-CM

## 2020-08-25 ENCOUNTER — Other Ambulatory Visit (HOSPITAL_COMMUNITY)
Admission: RE | Admit: 2020-08-25 | Discharge: 2020-08-25 | Disposition: A | Discharge status: Home / Self Care | Payer: BLUE CROSS/BLUE SHIELD | Source: Ambulatory Visit | Attending: Obstetrics & Gynecology | Admitting: Obstetrics & Gynecology

## 2020-08-25 ENCOUNTER — Encounter: Payer: Self-pay | Admitting: Obstetrics & Gynecology

## 2020-08-25 ENCOUNTER — Other Ambulatory Visit: Payer: Self-pay

## 2020-08-25 ENCOUNTER — Ambulatory Visit (INDEPENDENT_AMBULATORY_CARE_PROVIDER_SITE_OTHER): Payer: BLUE CROSS/BLUE SHIELD | Admitting: Obstetrics & Gynecology

## 2020-08-25 DIAGNOSIS — Z124 Encounter for screening for malignant neoplasm of cervix: Secondary | ICD-10-CM

## 2020-08-25 DIAGNOSIS — Z30011 Encounter for initial prescription of contraceptive pills: Secondary | ICD-10-CM

## 2020-08-25 MED ORDER — BALCOLTRA 0.1-20 MG-MCG(21) PO TABS: 1.0000 | ORAL_TABLET | Freq: Every day | ORAL | 3 refills | Status: DC

## 2020-08-25 NOTE — Progress Notes (Signed)
  OBSTETRICS POSTPARTUM CLINIC PROGRESS NOTE  Subjective:     Ashley Austin is a 25 y.o. G63P2002 female who presents for a postpartum visit. She is 6 weeks postpartum following a Term pregnancy or Uncomplicated pregnancy and delivery by Vaginal, no problems at delivery.  I have fully reviewed the prenatal and intrapartum course. Anesthesia: epidural.  Postpartum course has been complicated by uncomplicated.  Baby is feeding by Bottle.  Bleeding: patient has  resumed menses.  Bowel function is normal. Bladder function is normal.  Patient is not sexually active. Contraception method desired is OCP (estrogen/progesterone).  Postpartum depression screening: negative. Edinburgh 7.  The following portions of the patient's history were reviewed and updated as appropriate: allergies, current medications, past family history, past medical history, past social history, past surgical history and problem list.  Review of Systems Pertinent items are noted in HPI.  Objective:    BP 120/80   Ht 5\' 1"  (1.549 m)   Wt 261 lb (118.4 kg)   LMP 08/24/2020 (Exact Date)   Breastfeeding No   BMI 49.32 kg/m   General:  alert and no distress   Breasts:  inspection negative, no nipple discharge or bleeding, no masses or nodularity palpable  Lungs: clear to auscultation bilaterally  Heart:  regular rate and rhythm, S1, S2 normal, no murmur, click, rub or gallop  Abdomen: soft, non-tender; bowel sounds normal; no masses,  no organomegaly.     Vulva:  normal  Vagina: normal vagina, no discharge, exudate, lesion, or erythema  Cervix:  no cervical motion tenderness and no lesions  Corpus: normal size, contour, position, consistency, mobility, non-tender  Adnexa:  normal adnexa and no mass, fullness, tenderness  Rectal Exam: Not performed.          Assessment:  Post Partum Care visit 1. Postpartum care following vaginal delivery Will stop Labetalol at this time. Monitor for s/sx HTN  2. Encounter for  initial prescription of contraceptive pills  3. Screening for cervical cancer - Cytology - PAP   Plan:  See orders and Patient Instructions Follow up in: 12 months or as needed.   08/26/2020, MD, [facility], [facility] 08/25/2020  10:14 AM

## 2020-08-25 NOTE — Patient Instructions (Signed)
Stop BP medicine (Labetalol)  Start birth control pills Sunday: Ethinyl Estradiol; Levonorgestrel; Ferrous bisglycinate oral tablets What is this medicine? ETHINYL ESTRADIOL; LEVONORGESTREL; FERROUS BISGLYCINATE (ETH in il es tra DYE ole; LEE voh nor jes trel; FER [place] bis GLY sin ate) is an oral contraceptive (birth control pill). This product combines two types of female hormones, an estrogen and a progestin. It is used to prevent ovulation and pregnancy. This medicine may be used for other purposes; ask your health care provider or pharmacist if you have questions. COMMON BRAND NAME(S): Balcoltra What should I tell my health care provider before I take this medicine? They need to know if you have any of these conditions:  abnormal vaginal bleeding  blood vessel disease  breast, cervical, endometrial, ovarian, liver, or uterine cancer  diabetes  gallbladder disease  heart disease or recent heart attack  high blood pressure  high cholesterol  history of blood clots  kidney disease  liver disease  migraine headaches  smoke tobacco  stroke  systemic lupus erythematosus (SLE)  an unusual or allergic reaction to estrogens, progestins, other medicines, foods, dyes, or preservatives  pregnant or trying to get pregnant  breast-feeding How should I use this medicine? Take this medicine by mouth. To reduce nausea, this medicine may be taken with food. Follow the directions on the prescription label. Take this medicine at the same time each day and in the order directed on the package. Do not take your medicine more often than directed. A patient package insert for the product will be given with each prescription and refill. Read this sheet carefully each time. The sheet may change frequently. Contact your pediatrician regarding the use of this medicine in children. Special care may be needed. This medicine has been used in female children who have started having menstrual  periods. Overdosage: If you think you have taken too much of this medicine contact a poison control center or emergency room at once. NOTE: This medicine is only for you. Do not share this medicine with others. What if I miss a dose? If you miss a dose, refer to the patient information sheet you received with your medicine for direction. If you miss more than one pill, this medicine may not be as effective and you may need to use another form of birth control. What may interact with this medicine? Do not take this medicine with the following medication:  dasabuvir; ombitasvir; paritaprevir; ritonavir  ombitasvir; paritaprevir; ritonavir This medicine may also interact with the following medications:  acetaminophen  antibiotics or medicines for infections, especially rifampin, rifabutin, rifapentine, and griseofulvin, and possibly penicillins or tetracyclines  aprepitant  ascorbic acid (vitamin C)  atorvastatin  barbiturate medicines, such as phenobarbital  bosentan  caffeine  carbamazepine  clofibrate  cyclosporine  dantrolene  doxercalciferol  felbamate  grapefruit juice  hydrocortisone  medicines for anxiety or sleeping problems, such as diazepam or temazepam  medicines for diabetes, including pioglitazone  mineral oil  modafinil  mycophenolate  nefazodone  oxcarbazepine  phenytoin  prednisolone  ritonavir or other medicines for HIV infection or AIDS  rosuvastatin  selegiline  soy isoflavones supplements  St. John's wort  tamoxifen or raloxifene  theophylline  thyroid hormones  topiramate  warfarin This list may not describe all possible interactions. Give your health care provider a list of all the medicines, herbs, non-prescription drugs, or dietary supplements you use. Also tell them if you smoke, drink alcohol, or use illegal drugs. Some items may interact with your  medicine. What should I watch for while using this  medicine? Visit your doctor or health care professional for regular checks on your progress. You will need a regular breast and pelvic exam and Pap smear while on this medicine. Use an additional method of contraception during the first cycle that you take these tablets. If you have any reason to think you are pregnant, stop taking this medicine right away and contact your doctor or health care professional. If you are taking this medicine for hormone related problems, it may take several cycles of use to see improvement in your condition. Smoking increases the risk of getting a blood clot or having a stroke while you are taking birth control pills, especially if you are more than 26 years old. You are strongly advised not to smoke. This medicine can make your body retain fluid, making your fingers, hands, or ankles swell. Your blood pressure can go up. Contact your doctor or health care professional if you feel you are retaining fluid. This medicine can make you more sensitive to the sun. Keep out of the sun. If you cannot avoid being in the sun, wear protective clothing and use sunscreen. Do not use sun lamps or tanning beds/booths. If you wear contact lenses and notice visual changes, or if the lenses begin to feel uncomfortable, consult your eye care specialist. In some women, tenderness, swelling, or minor bleeding of the gums may occur. Notify your dentist if this happens. Brushing and flossing your teeth regularly may help limit this. See your dentist regularly and inform your dentist of the medicines you are taking. If you are going to have elective surgery, you may need to stop taking this medicine before the surgery. Consult your health care professional for advice. This medicine does not protect you against HIV infection (AIDS) or any other sexually transmitted diseases. What side effects may I notice from receiving this medicine? Side effects that you should report to your doctor or health  care professional as soon as possible:  allergic reactions like skin rash, itching or hives, swelling of the face, lips, or tongue  breast tissue changes or discharge  changes in vaginal bleeding during your period or between your periods  changes in vision  chest pain  confusion  coughing up blood  dizziness  feeling faint or lightheaded  headaches or migraines  leg, arm or groin pain  loss of balance or coordination  severe or sudden headaches  stomach pain (severe)  sudden shortness of breath  sudden numbness or weakness of the face, arm or leg  symptoms of vaginal infection like itching, irritation or unusual discharge  tenderness in the upper abdomen  trouble speaking or understanding  vomiting  yellowing of the eyes or skin Side effects that usually do not require medical attention (report these to your doctor or health care professional if they continue or are bothersome):  breakthrough bleeding and spotting that continues beyond the 3 initial cycles of pills  breast tenderness  mood changes, anxiety, depression, frustration, anger, or emotional outbursts  increased sensitivity to sun or ultraviolet light  nausea  skin rash, acne, or brown spots on the skin  weight gain (slight) This list may not describe all possible side effects. Call your doctor for medical advice about side effects. You may report side effects to FDA at 1-800-FDA-1088. Where should I keep my medicine? Keep out of the reach of children. Store at room temperature between 15 and 30 degrees C (59 and 86 degrees  F). Throw away any unused medicine after the expiration date. NOTE: This sheet is a summary. It may not cover all possible information. If you have questions about this medicine, talk to your doctor, pharmacist, or health care provider.  2021 Elsevier/Gold Standard (2016-04-23 15:12:39)

## 2020-08-26 LAB — CYTOLOGY - PAP
Chlamydia: NEGATIVE
Comment: NEGATIVE
Comment: NORMAL
Diagnosis: NEGATIVE
Neisseria Gonorrhea: NEGATIVE

## 2020-08-31 DIAGNOSIS — Z419 Encounter for procedure for purposes other than remedying health state, unspecified: Secondary | ICD-10-CM | POA: Diagnosis not present

## 2020-09-07 DIAGNOSIS — F4001 Agoraphobia with panic disorder: Secondary | ICD-10-CM | POA: Diagnosis not present

## 2020-09-07 DIAGNOSIS — R002 Palpitations: Secondary | ICD-10-CM | POA: Diagnosis not present

## 2020-09-07 DIAGNOSIS — E559 Vitamin D deficiency, unspecified: Secondary | ICD-10-CM | POA: Diagnosis not present

## 2020-09-07 DIAGNOSIS — F411 Generalized anxiety disorder: Secondary | ICD-10-CM | POA: Diagnosis not present

## 2020-09-07 DIAGNOSIS — N920 Excessive and frequent menstruation with regular cycle: Secondary | ICD-10-CM | POA: Diagnosis not present

## 2020-09-09 ENCOUNTER — Other Ambulatory Visit: Payer: Self-pay | Admitting: Obstetrics & Gynecology

## 2020-09-15 DIAGNOSIS — D539 Nutritional anemia, unspecified: Secondary | ICD-10-CM | POA: Diagnosis not present

## 2020-09-15 DIAGNOSIS — D509 Iron deficiency anemia, unspecified: Secondary | ICD-10-CM | POA: Diagnosis not present

## 2020-09-15 DIAGNOSIS — O905 Postpartum thyroiditis: Secondary | ICD-10-CM | POA: Diagnosis not present

## 2020-09-15 DIAGNOSIS — Z6841 Body Mass Index (BMI) 40.0 and over, adult: Secondary | ICD-10-CM | POA: Diagnosis not present

## 2020-09-15 DIAGNOSIS — Z7189 Other specified counseling: Secondary | ICD-10-CM | POA: Diagnosis not present

## 2020-09-15 DIAGNOSIS — E669 Obesity, unspecified: Secondary | ICD-10-CM | POA: Diagnosis not present

## 2020-09-15 DIAGNOSIS — E063 Autoimmune thyroiditis: Secondary | ICD-10-CM | POA: Diagnosis not present

## 2020-09-15 DIAGNOSIS — N920 Excessive and frequent menstruation with regular cycle: Secondary | ICD-10-CM | POA: Diagnosis not present

## 2020-09-15 DIAGNOSIS — F4001 Agoraphobia with panic disorder: Secondary | ICD-10-CM | POA: Diagnosis not present

## 2020-09-15 DIAGNOSIS — E559 Vitamin D deficiency, unspecified: Secondary | ICD-10-CM | POA: Diagnosis not present

## 2020-09-30 DIAGNOSIS — Z419 Encounter for procedure for purposes other than remedying health state, unspecified: Secondary | ICD-10-CM | POA: Diagnosis not present

## 2020-10-31 DIAGNOSIS — Z419 Encounter for procedure for purposes other than remedying health state, unspecified: Secondary | ICD-10-CM | POA: Diagnosis not present

## 2020-11-16 DIAGNOSIS — E669 Obesity, unspecified: Secondary | ICD-10-CM | POA: Diagnosis not present

## 2020-11-16 DIAGNOSIS — E559 Vitamin D deficiency, unspecified: Secondary | ICD-10-CM | POA: Diagnosis not present

## 2020-11-16 DIAGNOSIS — E063 Autoimmune thyroiditis: Secondary | ICD-10-CM | POA: Diagnosis not present

## 2020-11-16 DIAGNOSIS — O905 Postpartum thyroiditis: Secondary | ICD-10-CM | POA: Diagnosis not present

## 2020-11-22 DIAGNOSIS — Z7189 Other specified counseling: Secondary | ICD-10-CM | POA: Diagnosis not present

## 2020-11-22 DIAGNOSIS — Z6841 Body Mass Index (BMI) 40.0 and over, adult: Secondary | ICD-10-CM | POA: Diagnosis not present

## 2020-11-22 DIAGNOSIS — F411 Generalized anxiety disorder: Secondary | ICD-10-CM | POA: Diagnosis not present

## 2020-11-22 DIAGNOSIS — F4001 Agoraphobia with panic disorder: Secondary | ICD-10-CM | POA: Diagnosis not present

## 2020-11-22 DIAGNOSIS — N920 Excessive and frequent menstruation with regular cycle: Secondary | ICD-10-CM | POA: Diagnosis not present

## 2020-11-22 DIAGNOSIS — D509 Iron deficiency anemia, unspecified: Secondary | ICD-10-CM | POA: Diagnosis not present

## 2020-11-22 DIAGNOSIS — E559 Vitamin D deficiency, unspecified: Secondary | ICD-10-CM | POA: Diagnosis not present

## 2020-11-22 DIAGNOSIS — E669 Obesity, unspecified: Secondary | ICD-10-CM | POA: Diagnosis not present

## 2020-12-01 DIAGNOSIS — Z419 Encounter for procedure for purposes other than remedying health state, unspecified: Secondary | ICD-10-CM | POA: Diagnosis not present

## 2020-12-05 IMAGING — CR DG CHEST 2V
2 series · 2 of 2 positions shown · non-contrast
Comparison: None.

CLINICAL DATA: Shortness of breath

EXAM:
CHEST - 2 VIEW

[chest pa]
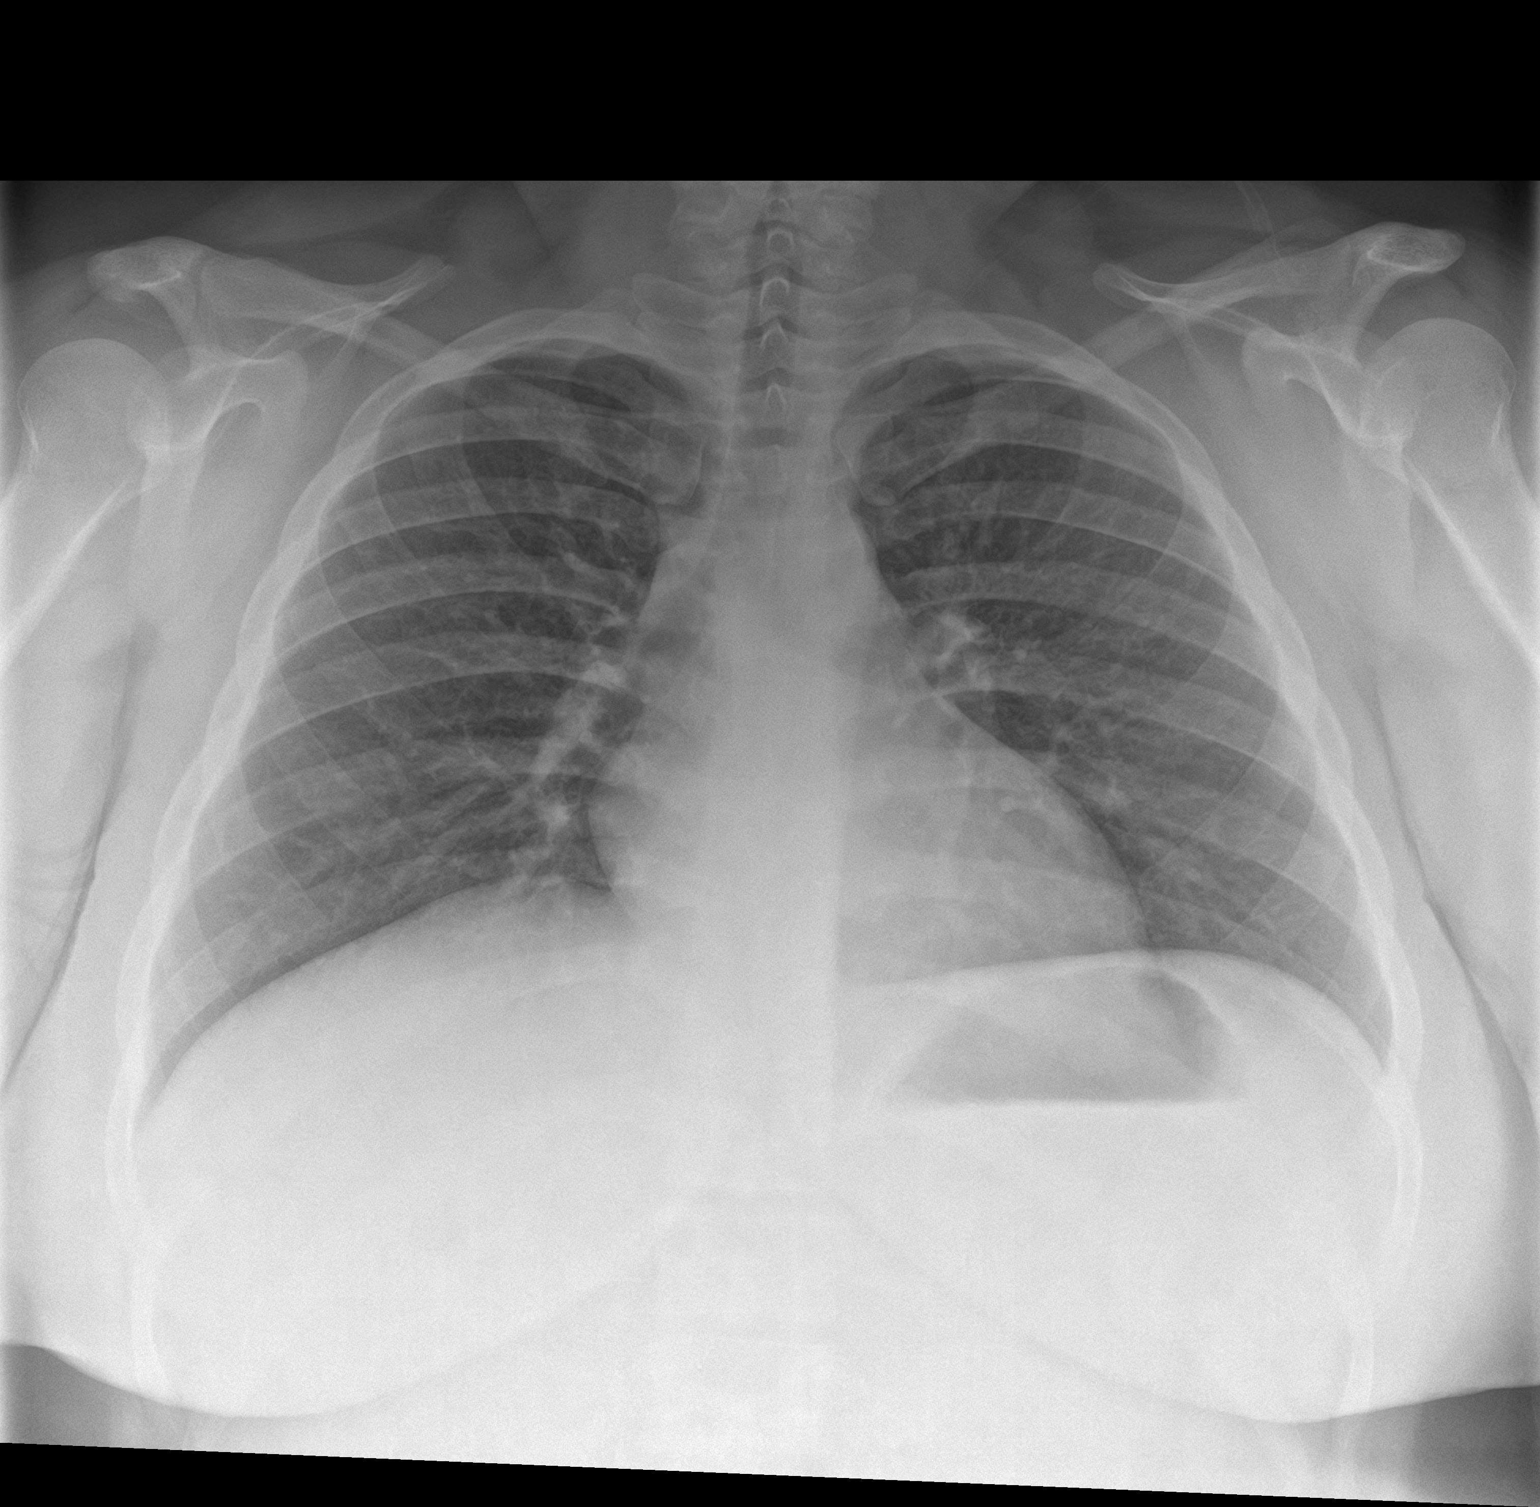

[chest lat]
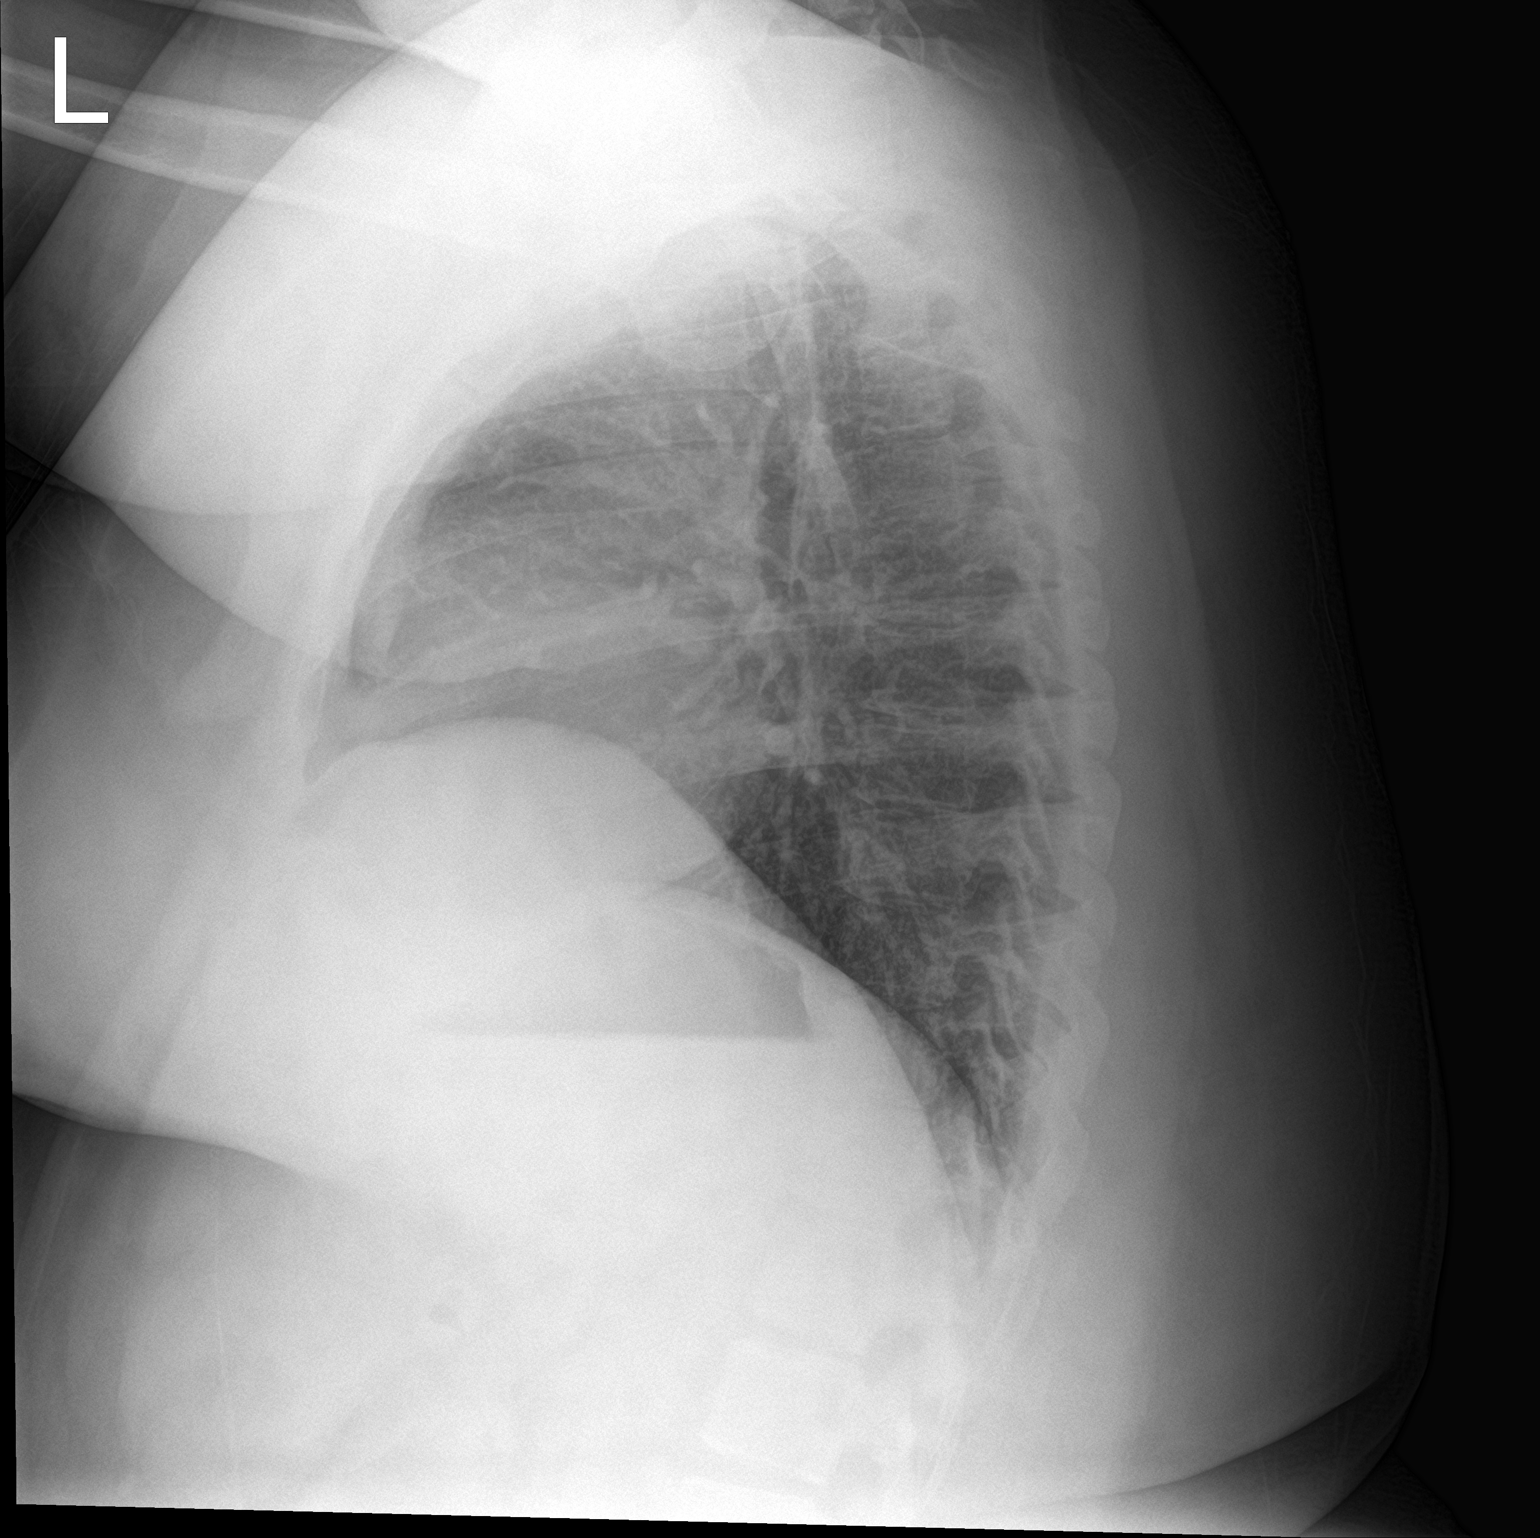

[2 of 2 positions shown; findings below may reference images not displayed]

FINDINGS: The heart size and mediastinal contours are within normal limits.
Both lungs are clear. The visualized skeletal structures are
unremarkable.
IMPRESSION: No active cardiopulmonary disease.

## 2020-12-31 DIAGNOSIS — Z419 Encounter for procedure for purposes other than remedying health state, unspecified: Secondary | ICD-10-CM | POA: Diagnosis not present

## 2021-01-31 DIAGNOSIS — Z419 Encounter for procedure for purposes other than remedying health state, unspecified: Secondary | ICD-10-CM | POA: Diagnosis not present

## 2021-03-02 DIAGNOSIS — Z419 Encounter for procedure for purposes other than remedying health state, unspecified: Secondary | ICD-10-CM | POA: Diagnosis not present

## 2021-03-05 DIAGNOSIS — R059 Cough, unspecified: Secondary | ICD-10-CM | POA: Diagnosis not present

## 2021-03-05 DIAGNOSIS — J101 Influenza due to other identified influenza virus with other respiratory manifestations: Secondary | ICD-10-CM | POA: Diagnosis not present

## 2021-03-05 DIAGNOSIS — Z20822 Contact with and (suspected) exposure to covid-19: Secondary | ICD-10-CM | POA: Diagnosis not present

## 2021-04-02 DIAGNOSIS — Z419 Encounter for procedure for purposes other than remedying health state, unspecified: Secondary | ICD-10-CM | POA: Diagnosis not present

## 2021-04-24 DIAGNOSIS — R059 Cough, unspecified: Secondary | ICD-10-CM | POA: Diagnosis not present

## 2021-04-24 DIAGNOSIS — Z20822 Contact with and (suspected) exposure to covid-19: Secondary | ICD-10-CM | POA: Diagnosis not present

## 2021-04-24 DIAGNOSIS — J069 Acute upper respiratory infection, unspecified: Secondary | ICD-10-CM | POA: Diagnosis not present

## 2021-05-03 DIAGNOSIS — Z419 Encounter for procedure for purposes other than remedying health state, unspecified: Secondary | ICD-10-CM | POA: Diagnosis not present

## 2021-05-31 DIAGNOSIS — Z419 Encounter for procedure for purposes other than remedying health state, unspecified: Secondary | ICD-10-CM | POA: Diagnosis not present

## 2021-07-01 DIAGNOSIS — Z419 Encounter for procedure for purposes other than remedying health state, unspecified: Secondary | ICD-10-CM | POA: Diagnosis not present

## 2021-07-31 DIAGNOSIS — Z419 Encounter for procedure for purposes other than remedying health state, unspecified: Secondary | ICD-10-CM | POA: Diagnosis not present

## 2021-08-31 DIAGNOSIS — Z419 Encounter for procedure for purposes other than remedying health state, unspecified: Secondary | ICD-10-CM | POA: Diagnosis not present

## 2021-09-01 ENCOUNTER — Other Ambulatory Visit: Payer: Self-pay

## 2021-09-01 MED ORDER — BALCOLTRA 0.1-20 MG-MCG(21) PO TABS: 1.0000 | ORAL_TABLET | Freq: Every day | ORAL | 0 refills | Status: DC

## 2021-09-01 NOTE — Telephone Encounter (Signed)
Received [facility] refill request from pharmacy, pt needs to be seen to have a refill she was last seen on 07/2020. Pt aware and is scheduling annual. Will send in 1 month of BC to last until appointment.

## 2021-09-29 ENCOUNTER — Ambulatory Visit: Payer: Medicaid Other | Admitting: Family Medicine

## 2021-09-30 DIAGNOSIS — Z419 Encounter for procedure for purposes other than remedying health state, unspecified: Secondary | ICD-10-CM | POA: Diagnosis not present

## 2021-10-27 ENCOUNTER — Ambulatory Visit (INDEPENDENT_AMBULATORY_CARE_PROVIDER_SITE_OTHER): Payer: Medicaid Other | Admitting: Family Medicine

## 2021-10-27 ENCOUNTER — Encounter: Payer: Self-pay | Admitting: Family Medicine

## 2021-10-27 VITALS — BP 126/84 | Ht 61.0 in | Wt 286.0 lb

## 2021-10-27 DIAGNOSIS — F329 Major depressive disorder, single episode, unspecified: Secondary | ICD-10-CM | POA: Insufficient documentation

## 2021-10-27 DIAGNOSIS — E66813 Obesity, class 3: Secondary | ICD-10-CM | POA: Insufficient documentation

## 2021-10-27 DIAGNOSIS — Z01419 Encounter for gynecological examination (general) (routine) without abnormal findings: Secondary | ICD-10-CM

## 2021-10-27 DIAGNOSIS — Z3009 Encounter for other general counseling and advice on contraception: Secondary | ICD-10-CM

## 2021-10-27 DIAGNOSIS — Z3041 Encounter for surveillance of contraceptive pills: Secondary | ICD-10-CM

## 2021-10-27 MED ORDER — NORGESTREL-ETHINYL ESTRADIOL 0.3-30 MG-MCG PO TABS: 1.0000 | ORAL_TABLET | Freq: Every day | ORAL | 5 refills | Status: DC

## 2021-10-27 NOTE — Progress Notes (Signed)
GYNECOLOGY ANNUAL PREVENTATIVE CARE ENCOUNTER NOTE  Subjective:   Ashley Austin is a 26 y.o. G29P2002 female here for a routine annual gynecologic exam.  Current complaints: None  ON COC and likes method. She does not miss pills. She reports she does have heavy periods. .     Declines STI screening today  Does not desire pregnancy in next year. Unsure if she ever wants more children.   Denies abnormal vaginal bleeding, discharge, pelvic pain, problems with intercourse or other gynecologic concerns.    Gynecologic History Patient's last menstrual period was 09/28/2021 (exact date). Contraception: OCP (estrogen/progesterone) Last Pap: 2022. Results were: normal Last mammogram: NA.   Health Maintenance Due  Topic Date Due   COVID-19 Vaccine (1) Never done   HPV VACCINES (1 - 2-dose series) Never done   Hepatitis C Screening  Never done    The following portions of the patient's history were reviewed and updated as appropriate: allergies, current medications, past family history, past medical history, past social history, past surgical history and problem list.  Review of Systems Pertinent items are noted in HPI.   Objective:  BP 126/84   Ht 5\' 1"  (1.549 m)   Wt 286 lb (129.7 kg)   LMP 09/28/2021 (Exact Date)   BMI 54.04 kg/m  CONSTITUTIONAL: Well-developed, well-nourished female in no acute distress.  HENT:  Normocephalic, atraumatic, External right and left ear normal. Oropharynx is clear and moist EYES:  No scleral icterus.  NECK: Normal range of motion, supple, no masses.  Normal thyroid.  SKIN: Skin is warm and dry. No rash noted. Not diaphoretic. No erythema. No pallor. NEUROLOGIC: Alert and oriented to person, place, and time. Normal reflexes, muscle tone coordination. No cranial nerve deficit noted. PSYCHIATRIC: Normal mood and affect. Normal behavior. Normal judgment and thought content. CARDIOVASCULAR: Normal heart rate noted, regular rhythm. 2+ distal  pulses. RESPIRATORY: Effort and breath sounds normal, no problems with respiration noted. BREASTS: Symmetric in size. No masses, skin changes, nipple drainage, or lymphadenopathy. ABDOMEN: Obese. Soft,  no distention noted.  No tenderness, rebound or guarding.  PELVIC: Normal appearing external genitalia; normal appearing vaginal mucosa and cervix.  No abnormal discharge noted. Pap not indicates, STI screening declined.  Normal uterine size, no other palpable masses, no uterine or adnexal tenderness. MUSCULOSKELETAL: Normal range of motion.     Assessment and Plan:  1) Annual gynecologic examination:   Routine preventative health maintenance measures emphasized.  2) Contraception counseling: Reviewed all forms of birth control options available including abstinence; over the counter/barrier methods; hormonal contraceptive medication including pill, patch, ring, injection,contraceptive implant; hormonal and nonhormonal IUDs; permanent sterilization options including vasectomy and the various tubal sterilization modalities. Risks and benefits reviewed.  Questions were answered.  Written information was also given to the patient to review.  Patient desires Continuously dosed OCP, this was prescribed for patient. She will follow up in  36yr for surveillance.  She was told to call with any further questions, or with any concerns about this method of contraception.  Emphasized use of condoms 100% of the time for STI prevention.  1. Well woman exam with routine gynecological exam Up to date Pap in 2025 Declined STI screening  2. Encounter for other general counseling or advice on contraception   3. Current episode of major depressive disorder without prior episode, unspecified depression episode severity Stable  4. Obesity, morbid, BMI 50 or higher (HCC) Reviewed weight and healthy lifestyle  5. Encounter for surveillance of contraceptive pills -  norgestrel-ethinyl estradiol (LO/OVRAL) 0.3-30  MG-MCG tablet; Take 1 tablet by mouth daily. Take 21 days of active medication and skip the last 7 days of the pack and start a new pack  Dispense: 84 tablet; Refill: 5   Please refer to After Visit Summary for other counseling recommendations.   No follow-ups on file.  Federico Flake, MD, MPH, ABFM Attending Physician Center for [facility]

## 2021-10-31 DIAGNOSIS — Z419 Encounter for procedure for purposes other than remedying health state, unspecified: Secondary | ICD-10-CM | POA: Diagnosis not present

## 2022-04-20 DIAGNOSIS — N920 Excessive and frequent menstruation with regular cycle: Secondary | ICD-10-CM | POA: Diagnosis not present

## 2022-04-20 DIAGNOSIS — E063 Autoimmune thyroiditis: Secondary | ICD-10-CM | POA: Diagnosis not present

## 2022-04-20 DIAGNOSIS — D509 Iron deficiency anemia, unspecified: Secondary | ICD-10-CM | POA: Diagnosis not present

## 2022-04-20 DIAGNOSIS — O905 Postpartum thyroiditis: Secondary | ICD-10-CM | POA: Diagnosis not present

## 2022-04-27 DIAGNOSIS — F4001 Agoraphobia with panic disorder: Secondary | ICD-10-CM | POA: Diagnosis not present

## 2022-04-27 DIAGNOSIS — D509 Iron deficiency anemia, unspecified: Secondary | ICD-10-CM | POA: Diagnosis not present

## 2022-04-27 DIAGNOSIS — E669 Obesity, unspecified: Secondary | ICD-10-CM | POA: Diagnosis not present

## 2022-04-27 DIAGNOSIS — E559 Vitamin D deficiency, unspecified: Secondary | ICD-10-CM | POA: Diagnosis not present

## 2022-04-27 DIAGNOSIS — Z6841 Body Mass Index (BMI) 40.0 and over, adult: Secondary | ICD-10-CM | POA: Diagnosis not present

## 2022-05-06 ENCOUNTER — Encounter: Payer: Self-pay | Admitting: Family Medicine

## 2022-05-14 NOTE — Progress Notes (Unsigned)
    Morayati, Lourdes Sledge, MD   No chief complaint on file.   HPI:      [name] is a 27 y.o. G2P2002 whose LMP was No LMP recorded., presents today for *** On OCPs Neg pap 08/25/20  Patient Active Problem List   Diagnosis Date Noted   Current episode of major depressive disorder without prior episode 10/27/2021   Obesity, morbid, BMI 50 or higher ([place]) 10/27/2021    Past Surgical History:  Procedure Laterality Date   NO PAST SURGERIES      Family History  Problem Relation Age of Onset   Breast cancer Neg Hx    Ovarian cancer Neg Hx     Social History   Socioeconomic History   Marital status: Single    Spouse name: Not on file   Number of children: Not on file   Years of education: Not on file   Highest education level: Not on file  Occupational History   Not on file  Tobacco Use   Smoking status: Never   Smokeless tobacco: Never  Vaping Use   Vaping Use: Never used  Substance and Sexual Activity   Alcohol use: Not Currently   Drug use: Never   Sexual activity: Yes    Birth control/protection: Pill  Other Topics Concern   Not on file  Social History Narrative   Not on file   Social Determinants of Health   Financial Resource Strain: Not on file  Food Insecurity: Not on file  Transportation Needs: Not on file  Physical Activity: Not on file  Stress: Not on file  Social Connections: Not on file  Intimate Partner Violence: Not on file    Outpatient Medications Prior to Visit  Medication Sig Dispense Refill   norgestrel-ethinyl estradiol (LO/OVRAL) 0.3-30 MG-MCG tablet Take 1 tablet by mouth daily. Take 21 days of active medication and skip the last 7 days of the pack and start a new pack 84 tablet 5   PARoxetine (PAXIL) 10 MG tablet TAKE 1 TABLET BY MOUTH EVERY DAY 90 tablet 4   No facility-administered medications prior to visit.      ROS:  Review of Systems BREAST: No symptoms   OBJECTIVE:   Vitals:  There were no vitals taken  for this visit.  Physical Exam  Results: No results found for this or any previous visit (from the past 24 hour(s)).   Assessment/Plan: No diagnosis found.    No orders of the defined types were placed in this encounter.     No follow-ups on file.  Tika Hannis B. Charrise Lardner, PA-C 05/14/2022 7:39 PM

## 2022-05-15 ENCOUNTER — Encounter: Payer: Self-pay | Admitting: Obstetrics and Gynecology

## 2022-05-15 ENCOUNTER — Ambulatory Visit (INDEPENDENT_AMBULATORY_CARE_PROVIDER_SITE_OTHER): Payer: BC Managed Care – PPO | Admitting: Obstetrics and Gynecology

## 2022-05-15 VITALS — BP 120/90 | Ht 61.0 in | Wt 271.0 lb

## 2022-05-15 DIAGNOSIS — N939 Abnormal uterine and vaginal bleeding, unspecified: Secondary | ICD-10-CM

## 2022-05-15 DIAGNOSIS — Z3202 Encounter for pregnancy test, result negative: Secondary | ICD-10-CM | POA: Diagnosis not present

## 2022-05-15 DIAGNOSIS — N921 Excessive and frequent menstruation with irregular cycle: Secondary | ICD-10-CM

## 2022-05-15 LAB — POCT URINE PREGNANCY: Preg Test, Ur: NEGATIVE

## 2022-05-15 NOTE — Patient Instructions (Signed)
I value your feedback and you entrusting us with your care. If you get a [place] patient survey, I would appreciate you taking the time to let us know about your experience today. Thank you! ? ? ?

## 2022-10-19 ENCOUNTER — Ambulatory Visit (INDEPENDENT_AMBULATORY_CARE_PROVIDER_SITE_OTHER): Payer: BC Managed Care – PPO | Admitting: Physician Assistant

## 2022-10-19 ENCOUNTER — Encounter: Payer: Self-pay | Admitting: Physician Assistant

## 2022-10-19 VITALS — BP 128/60 | HR 91 | Ht 61.0 in | Wt 254.0 lb

## 2022-10-19 DIAGNOSIS — Z1159 Encounter for screening for other viral diseases: Secondary | ICD-10-CM

## 2022-10-19 DIAGNOSIS — R7989 Other specified abnormal findings of blood chemistry: Secondary | ICD-10-CM

## 2022-10-19 DIAGNOSIS — Z7689 Persons encountering health services in other specified circumstances: Secondary | ICD-10-CM | POA: Diagnosis not present

## 2022-10-19 DIAGNOSIS — Z862 Personal history of diseases of the blood and blood-forming organs and certain disorders involving the immune mechanism: Secondary | ICD-10-CM

## 2022-10-19 DIAGNOSIS — Z6841 Body Mass Index (BMI) 40.0 and over, adult: Secondary | ICD-10-CM

## 2022-10-19 NOTE — Progress Notes (Signed)
Date:  10/19/2022   Name:  Ashley Austin   DOB:  January 02, 1996   MRN:  [id number]   Chief Complaint: Establish Care  HPI Ashley Austin is a delightful 27 y.o. female with history dep/anx well-controlled on Paxil new to our practice today to establish care with no acute complaints. Previously seen by Dr. Patrecia Pace but no endocrine disorder. Pap next due May 2025. History of menorrhagia with mild anemia, periods improved recently, on OCP with good compliance. Last labs in Care Everywhere from 2022.   Medication list has been reviewed and updated.  Current Meds  Medication Sig   norgestrel-ethinyl estradiol (LO/OVRAL) 0.3-30 MG-MCG tablet Take 1 tablet by mouth daily. Take 21 days of active medication and skip the last 7 days of the pack and start a new pack   PARoxetine (PAXIL) 20 MG tablet Take 20 mg by mouth daily.     Review of Systems  Constitutional:  Negative for fatigue and fever.  Respiratory:  Negative for chest tightness and shortness of breath.   Cardiovascular:  Negative for chest pain and palpitations.  Gastrointestinal:  Negative for abdominal pain.    Patient Active Problem List   Diagnosis Date Noted   Current episode of major depressive disorder without prior episode 10/27/2021   Class 3 severe obesity without serious comorbidity with body mass index (BMI) of 45.0 to 49.9 in adult (HCC) 10/27/2021    No Known Allergies  Immunization History  Administered Date(s) Administered   Influenza,inj,Quad PF,6+ Mos 06/16/2019, 12/29/2019   Tdap 05/10/2020   Varicella 07/17/2020    Past Surgical History:  Procedure Laterality Date   NO PAST SURGERIES      Social History   Tobacco Use   Smoking status: Never   Smokeless tobacco: Never  Vaping Use   Vaping status: Never Used  Substance Use Topics   Alcohol use: Not Currently   Drug use: Never    Family History  Problem Relation Age of Onset   Breast cancer Neg Hx    Ovarian cancer Neg Hx         10/19/2022     2:07 PM  GAD 7 : Generalized Anxiety Score  Nervous, Anxious, on Edge 2  Control/stop worrying 0  Worry too much - different things 0  Trouble relaxing 0  Restless 0  Easily annoyed or irritable 1  Afraid - awful might happen 0  Total GAD 7 Score 3  Anxiety Difficulty Not difficult at all       10/19/2022    2:07 PM  Depression screen PHQ 2/9  Decreased Interest 0  Down, Depressed, Hopeless 0  PHQ - 2 Score 0  Altered sleeping 0  Tired, decreased energy 0  Change in appetite 0  Feeling bad or failure about yourself  0  Trouble concentrating 0  Moving slowly or fidgety/restless 0  Suicidal thoughts 0  PHQ-9 Score 0  Difficult doing work/chores Not difficult at all    BP Readings from Last 3 Encounters:  10/19/22 128/60  05/15/22 (!) 120/90  10/27/21 126/84    Wt Readings from Last 3 Encounters:  10/19/22 254 lb (115.2 kg)  05/15/22 271 lb (122.9 kg)  10/27/21 286 lb (129.7 kg)    BP 128/60   Pulse 91   Ht 5\' 1"  (1.549 m)   Wt 254 lb (115.2 kg)   SpO2 99%   BMI 47.99 kg/m   Physical Exam Vitals and nursing note reviewed.  Constitutional:  Appearance: Normal appearance.  Neck:     Vascular: No carotid bruit.  Cardiovascular:     Rate and Rhythm: Normal rate and regular rhythm.     Heart sounds: No murmur heard.    No friction rub. No gallop.  Pulmonary:     Effort: Pulmonary effort is normal.     Breath sounds: Normal breath sounds.  Abdominal:     General: There is no distension.  Musculoskeletal:        General: Normal range of motion.  Skin:    General: Skin is warm and dry.  Neurological:     Mental Status: She is alert and oriented to person, place, and time.     Gait: Gait is intact.  Psychiatric:        Mood and Affect: Mood and affect normal.     Recent Labs     Component Value Date/Time   NA 137 07/20/2020 1057   NA 137 03/09/2013 1234   K 3.7 07/20/2020 1057   K 4.0 03/09/2013 1234   CL 104 07/20/2020 1057   CL 108  (H) 03/09/2013 1234   CO2 23 07/20/2020 1057   CO2 21 03/09/2013 1234   GLUCOSE 97 07/20/2020 1057   GLUCOSE 76 03/09/2013 1234   BUN <5 (L) 07/20/2020 1057   BUN 9 03/09/2013 1234   CREATININE 0.47 07/20/2020 1057   CREATININE 0.51 (L) 03/09/2013 1234   CALCIUM 9.0 07/20/2020 1057   CALCIUM 9.1 03/09/2013 1234   PROT 7.5 07/20/2020 1057   ALBUMIN 3.2 (L) 07/20/2020 1057   AST 35 07/20/2020 1057   AST 28 (H) 03/09/2013 1234   ALT 49 (H) 07/20/2020 1057   ALKPHOS 119 07/20/2020 1057   BILITOT 0.7 07/20/2020 1057   GFRNONAA >60 07/20/2020 1057   GFRNONAA >60 03/09/2013 1234   GFRAA >60 05/06/2019 2331   GFRAA >60 03/09/2013 1234    Lab Results  Component Value Date   WBC 9.2 07/20/2020   HGB 11.5 (L) 07/20/2020   HCT 36.0 07/20/2020   MCV 77.4 (L) 07/20/2020   PLT 282 07/20/2020   Lab Results  Component Value Date   HGBA1C 5.7 10/03/2011   Lab Results  Component Value Date   CHOL 154 10/03/2011   HDL 55 10/03/2011   LDLCALC 84 10/03/2011   TRIG 75 10/03/2011   Lab Results  Component Value Date   TSH 3.72 10/03/2011     Assessment and Plan:  1. Encounter to establish care Patient seems to be in good health. No records available for review at this time. She does not need any medication refills.  2. History of hypochromic microcytic anemia Check CBC and iron labs today.  - CBC with Differential/Platelet - Iron, TIBC and Ferritin Panel  3. Elevated LFTs Repeat CMP - Comprehensive metabolic panel  4. Need for hepatitis C screening test One-time Hep C screen today - Hepatitis C antibody  5. Class 3 severe obesity without serious comorbidity with body mass index (BMI) of 45.0 to 49.9 in adult, unspecified obesity type [facility]) Chart review shows patient has done very well in the last year, having lost over 30 lbs. We will check baseline labs today.  - CBC with Differential/Platelet - Comprehensive metabolic panel - TSH   Return in about 1 year (around  10/19/2023) for CPE w/ pap.    Alvester Morin, PA-C, DMSc, Nutritionist [facility] Primary Care and Sports Medicine [facility] [phone]

## 2022-10-19 NOTE — Patient Instructions (Signed)

## 2022-10-20 LAB — COMPREHENSIVE METABOLIC PANEL
ALT: 12 IU/L (ref 0–32)
AST: 12 IU/L (ref 0–40)
Albumin: 4.1 g/dL (ref 4.0–5.0)
Alkaline Phosphatase: 79 IU/L (ref 44–121)
BUN/Creatinine Ratio: 18 (ref 9–23)
BUN: 13 mg/dL (ref 6–20)
Bilirubin Total: <0.2 mg/dL (ref 0.0–1.2)
CO2: 21 mmol/L (ref 20–29)
Calcium: 9.3 mg/dL (ref 8.7–10.2)
Chloride: 105 mmol/L (ref 96–106)
Creatinine, Ser: 0.71 mg/dL (ref 0.57–1.00)
Globulin, Total: 2.7 g/dL (ref 1.5–4.5)
Glucose: 101 mg/dL — ABNORMAL HIGH (ref 70–99)
Potassium: 4.1 mmol/L (ref 3.5–5.2)
Sodium: 139 mmol/L (ref 134–144)
Total Protein: 6.8 g/dL (ref 6.0–8.5)
eGFR: 119 mL/min/{1.73_m2} (ref >59)

## 2022-10-20 LAB — TSH: TSH: 1.68 u[IU]/mL (ref 0.450–4.500)

## 2022-10-20 LAB — CBC WITH DIFFERENTIAL/PLATELET
Basophils Absolute: 0 10*3/uL (ref 0.0–0.2)
Basos: 0 %
EOS (ABSOLUTE): 0.1 10*3/uL (ref 0.0–0.4)
Eos: 1 %
Hematocrit: 40.7 % (ref 34.0–46.6)
Hemoglobin: 12.7 g/dL (ref 11.1–15.9)
Immature Grans (Abs): 0.1 10*3/uL (ref 0.0–0.1)
Immature Granulocytes: 1 %
Lymphocytes Absolute: 1.8 10*3/uL (ref 0.7–3.1)
Lymphs: 18 %
MCH: 25.1 pg — ABNORMAL LOW (ref 26.6–33.0)
MCHC: 31.2 g/dL — ABNORMAL LOW (ref 31.5–35.7)
MCV: 81 fL (ref 79–97)
Monocytes Absolute: 0.5 10*3/uL (ref 0.1–0.9)
Monocytes: 5 %
Neutrophils Absolute: 7.5 10*3/uL — ABNORMAL HIGH (ref 1.4–7.0)
Neutrophils: 75 %
Platelets: 300 10*3/uL (ref 150–450)
RBC: 5.05 x10E6/uL (ref 3.77–5.28)
RDW: 13.7 % (ref 11.7–15.4)
WBC: 10 10*3/uL (ref 3.4–10.8)

## 2022-10-20 LAB — IRON,TIBC AND FERRITIN PANEL
Ferritin: 16 ng/mL (ref 15–150)
Iron Saturation: 18 % (ref 15–55)
Iron: 74 ug/dL (ref 27–159)
Total Iron Binding Capacity: 406 ug/dL (ref 250–450)
UIBC: 332 ug/dL (ref 131–425)

## 2022-10-20 LAB — HEPATITIS C ANTIBODY: Hep C Virus Ab: NONREACTIVE

## 2022-11-19 ENCOUNTER — Emergency Department
Admission: EM | Admit: 2022-11-19 | Discharge: 2022-11-19 | Disposition: A | Discharge status: Home / Self Care | Payer: BC Managed Care – PPO | Attending: Emergency Medicine | Admitting: Emergency Medicine

## 2022-11-19 ENCOUNTER — Emergency Department: Payer: BC Managed Care – PPO

## 2022-11-19 ENCOUNTER — Other Ambulatory Visit: Payer: Self-pay

## 2022-11-19 DIAGNOSIS — R109 Unspecified abdominal pain: Secondary | ICD-10-CM

## 2022-11-19 DIAGNOSIS — N281 Cyst of kidney, acquired: Secondary | ICD-10-CM | POA: Diagnosis not present

## 2022-11-19 DIAGNOSIS — R1033 Periumbilical pain: Secondary | ICD-10-CM | POA: Insufficient documentation

## 2022-11-19 DIAGNOSIS — R1031 Right lower quadrant pain: Secondary | ICD-10-CM | POA: Diagnosis not present

## 2022-11-19 LAB — COMPREHENSIVE METABOLIC PANEL
ALT: 20 U/L (ref 0–44)
AST: 16 U/L (ref 15–41)
Albumin: 3.7 g/dL (ref 3.5–5.0)
Alkaline Phosphatase: 62 U/L (ref 38–126)
Anion gap: 12 (ref 5–15)
BUN: 6 mg/dL (ref 6–20)
CO2: 21 mmol/L — ABNORMAL LOW (ref 22–32)
Calcium: 8.8 mg/dL — ABNORMAL LOW (ref 8.9–10.3)
Chloride: 101 mmol/L (ref 98–111)
Creatinine, Ser: 0.57 mg/dL (ref 0.44–1.00)
GFR, Estimated: >60 mL/min (ref >60)
Glucose, Bld: 84 mg/dL (ref 70–99)
Potassium: 4 mmol/L (ref 3.5–5.1)
Sodium: 134 mmol/L — ABNORMAL LOW (ref 135–145)
Total Bilirubin: 0.6 mg/dL (ref 0.3–1.2)
Total Protein: 7.6 g/dL (ref 6.5–8.1)

## 2022-11-19 LAB — URINALYSIS, ROUTINE W REFLEX MICROSCOPIC
Bacteria, UA: NONE SEEN
Bilirubin Urine: NEGATIVE
Glucose, UA: NEGATIVE mg/dL
Ketones, ur: 20 mg/dL — AB
Nitrite: NEGATIVE
Protein, ur: NEGATIVE mg/dL
Specific Gravity, Urine: 1.005 (ref 1.005–1.030)
pH: 6 (ref 5.0–8.0)

## 2022-11-19 LAB — LIPASE, BLOOD: Lipase: 27 U/L (ref 11–51)

## 2022-11-19 LAB — PREGNANCY, URINE: Preg Test, Ur: NEGATIVE

## 2022-11-19 LAB — CBC
HCT: 38.4 % (ref 36.0–46.0)
Hemoglobin: 12 g/dL (ref 12.0–15.0)
MCH: 25.1 pg — ABNORMAL LOW (ref 26.0–34.0)
MCHC: 31.3 g/dL (ref 30.0–36.0)
MCV: 80.3 fL (ref 80.0–100.0)
Platelets: 312 10*3/uL (ref 150–400)
RBC: 4.78 MIL/uL (ref 3.87–5.11)
RDW: 13.6 % (ref 11.5–15.5)
WBC: 13.8 10*3/uL — ABNORMAL HIGH (ref 4.0–10.5)
nRBC: 0 % (ref 0.0–0.2)

## 2022-11-19 MED ORDER — IOHEXOL 350 MG/ML SOLN
100.0000 mL | Freq: Once | INTRAVENOUS | Status: AC | PRN
  Administered 2022-11-19: 100 mL via INTRAVENOUS

## 2022-11-19 MED ORDER — CEPHALEXIN 500 MG PO CAPS: 500.0000 mg | ORAL_CAPSULE | Freq: Four times a day (QID) | ORAL | 0 refills | Status: AC

## 2022-11-19 NOTE — ED Provider Notes (Signed)
[facility] Provider Note  Patient Contact: 6:59 PM (approximate)   History   Abdominal Pain   HPI  Ashley Austin is a 27 y.o. female with a largely unremarkable past medical history, presents to the emergency department with periumbilical and right lower quadrant abdominal pain that has occurred for the past 3 to 4 days.  Patient reports that symptoms started around her umbilicus but have since radiated to the right lower quadrant.  She thinks that she had fever yesterday.  She has had no associated nausea, vomiting or diarrhea.  No dysuria, hematuria or increased urinary frequency.  She denies possibility of pregnancy.  No sick contacts in the home with similar symptoms.      Physical Exam   Triage Vital Signs: ED Triage Vitals  Encounter Vitals Group     BP 11/19/22 1801 131/77     Systolic BP Percentile --      Diastolic BP Percentile --      Pulse Rate 11/19/22 1801 (!) 106     Resp 11/19/22 1801 16     Temp 11/19/22 1801 99.4 F (37.4 C)     Temp Source 11/19/22 1801 Oral     SpO2 11/19/22 1801 100 %     Weight 11/19/22 1855 253 lb 15.5 oz (115.2 kg)     Height 11/19/22 1855 5\' 1"  (1.549 m)     Head Circumference --      Peak Flow --      Pain Score 11/19/22 1806 7     Pain Loc --      Pain Education --      Exclude from Growth Chart --     Most recent vital signs: Vitals:   11/19/22 1801  BP: 131/77  Pulse: (!) 106  Resp: 16  Temp: 99.4 F (37.4 C)  SpO2: 100%     General: Alert and in no acute distress. Eyes:  PERRL. EOMI. Head: No acute traumatic findings ENT:      Nose: No congestion/rhinnorhea.      Mouth/Throat: Mucous membranes are moist.  Neck: No stridor. No cervical spine tenderness to palpation. Cardiovascular:  Good peripheral perfusion Respiratory: Normal respiratory effort without tachypnea or retractions. Lungs CTAB. Good air entry to the bases with no decreased or absent breath sounds. Gastrointestinal:  Bowel sounds 4 quadrants. Soft and nontender to palpation. No guarding or rigidity. No palpable masses. No distention. No CVA tenderness. Musculoskeletal: Full range of motion to all extremities.  Neurologic:  No gross focal neurologic deficits are appreciated.  Skin:   No rash noted    ED Results / Procedures / Treatments   Labs (all labs ordered are listed, but only abnormal results are displayed) Labs Reviewed  COMPREHENSIVE METABOLIC PANEL - Abnormal; Notable for the following components:      Result Value   Sodium 134 (*)    CO2 21 (*)    Calcium 8.8 (*)    All other components within normal limits  CBC - Abnormal; Notable for the following components:   WBC 13.8 (*)    MCH 25.1 (*)    All other components within normal limits  URINALYSIS, ROUTINE W REFLEX MICROSCOPIC - Abnormal; Notable for the following components:   Color, Urine YELLOW (*)    APPearance HAZY (*)    Hgb urine dipstick MODERATE (*)    Ketones, ur 20 (*)    Leukocytes,Ua MODERATE (*)    All other components within normal limits  LIPASE, BLOOD  PREGNANCY, URINE      RADIOLOGY  I personally viewed and evaluated these images as part of my medical decision making, as well as reviewing the written report by the radiologist.  ED Provider Interpretation: CT abdomen pelvis unremarkable.   PROCEDURES:  Critical Care performed: No  Procedures   MEDICATIONS ORDERED IN ED: Medications  iohexol (OMNIPAQUE) 350 MG/ML injection 100 mL (100 mLs Intravenous Contrast Given 11/19/22 1935)     IMPRESSION / MDM / ASSESSMENT AND PLAN / ED COURSE  I reviewed the triage vital signs and the nursing notes.                              Assessment and plan Abdominal pain 27 year old female presents to the emergency department with periumbilical and right lower quadrant abdominal pain for the past 3 to 4 days.  Patient had low-grade fever at triage and was mildly tachycardic.  Abdomen was soft and tender in  the right lower quadrant without guarding.  Differential diagnosis includes appendicitis, ovarian cyst, nephrolithiasis, pyelonephritis...  Patient's white blood cell count elevated at 13.8.  Urinalysis with a moderate amount of blood and leukocytes.  CMP largely within reference range.  Lipase reassuring.  CT abdomen and pelvis unremarkable.  Will treat patient for UTI with Keflex 4 times daily for the next 7 days.  Return precautions were given to return with new or worsening symptoms.  All patient questions were answered.      FINAL CLINICAL IMPRESSION(S) / ED DIAGNOSES   Final diagnoses:  Abdominal pain, unspecified abdominal location     Rx / DC Orders   ED Discharge Orders          Ordered    cephALEXin (KEFLEX) 500 MG capsule  4 times daily        11/19/22 2021             Note:  This document was prepared using Dragon voice recognition software and may include unintentional dictation errors.   Pia Mau [place], PA-C 11/19/22 2025    Corena Herter, MD 11/20/22 [phone]

## 2022-11-19 NOTE — ED Notes (Signed)
See triage note   Presents with some abd pain and nausea  Low grade temp on arrival

## 2022-11-19 NOTE — ED Triage Notes (Signed)
Pt presents to ED with c/o of ABD pain since Thursday. Pt states some nausea this morning.

## 2022-11-19 NOTE — Discharge Instructions (Addendum)
Take Keflex as directed.  

## 2022-11-20 ENCOUNTER — Telehealth: Payer: Self-pay

## 2022-11-20 NOTE — Transitions of Care (Post Inpatient/ED Visit) (Unsigned)
   11/20/2022  Name: Ashley Austin MRN: [id number] DOB: 1995-11-14  Today's TOC FU Call Status: Today's TOC FU Call Status:: Unsuccessful Call (1st Attempt) Unsuccessful Call (1st Attempt) Date: 11/20/22  Attempted to reach the patient regarding the most recent Inpatient/ED visit.  Follow Up Plan: Additional outreach attempts will be made to reach the patient to complete the Transitions of Care (Post Inpatient/ED visit) call.   [employer], CMA

## 2022-11-21 ENCOUNTER — Ambulatory Visit: Payer: BC Managed Care – PPO | Admitting: Physician Assistant

## 2022-11-21 ENCOUNTER — Encounter: Payer: Self-pay | Admitting: Physician Assistant

## 2022-11-21 VITALS — BP 118/66 | HR 106 | Temp 98.7°F | Ht 61.0 in | Wt 250.0 lb

## 2022-11-21 DIAGNOSIS — F320 Major depressive disorder, single episode, mild: Secondary | ICD-10-CM | POA: Diagnosis not present

## 2022-11-21 DIAGNOSIS — N3000 Acute cystitis without hematuria: Secondary | ICD-10-CM | POA: Diagnosis not present

## 2022-11-21 MED ORDER — PAROXETINE HCL 20 MG PO TABS
20.0000 mg | ORAL_TABLET | Freq: Every day | ORAL | 1 refills | Status: DC
Start: 2022-11-21 — End: 2023-05-27

## 2022-11-21 NOTE — Progress Notes (Signed)
Date:  11/21/2022   Name:  Ashley Austin   DOB:  [birth year]   MRN:  [id number]   Chief Complaint: Urinary Tract Infection [facility] follow up , pt feels better, started antibiotic yesterday )  HPI Colin presents for ED follow-up on UTI after being seen in the ED 11/19/2022.  I have reviewed this note.  She was prescribed Keflex which she started yesterday, has taken a total of 3 doses so far, believes she is a little bit better today.  No significant issues with the antibiotic.  Present symptoms include some lower abdominal pain, urinary frequency, and mild right side pain.  Denies fever, nausea, vomiting.  Perhaps unrelated, she notes mild constipation, last BM was Sunday (3 days ago).  She does have a laxative at home but has not used it yet.  Also requesting refill on paroxetine which seems to be doing a nice job controlling her symptoms of depression and anxiety.   Medication list has been reviewed and updated.  Current Meds  Medication Sig   cephALEXin (KEFLEX) 500 MG capsule Take 1 capsule (500 mg total) by mouth 4 (four) times daily for 7 days.   norgestrel-ethinyl estradiol (LO/OVRAL) 0.3-30 MG-MCG tablet Take 1 tablet by mouth daily. Take 21 days of active medication and skip the last 7 days of the pack and start a new pack   PARoxetine (PAXIL) 20 MG tablet Take 20 mg by mouth daily.     Review of Systems  Constitutional:  Negative for fatigue and fever.  Respiratory:  Negative for chest tightness and shortness of breath.   Cardiovascular:  Negative for chest pain and palpitations.  Gastrointestinal:  Positive for abdominal pain (lower).  Genitourinary:  Positive for flank pain and frequency.    Patient Active Problem List   Diagnosis Date Noted   Current episode of major depressive disorder without prior episode 10/27/2021   Class 3 severe obesity without serious comorbidity with body mass index (BMI) of 45.0 to 49.9 in adult (HCC) 10/27/2021    No Known  Allergies  Immunization History  Administered Date(s) Administered   Influenza,inj,Quad PF,6+ Mos 06/16/2019, 12/29/2019   Tdap 05/10/2020   Varicella 07/17/2020    Past Surgical History:  Procedure Laterality Date   NO PAST SURGERIES      Social History   Tobacco Use   Smoking status: Never   Smokeless tobacco: Never  Vaping Use   Vaping status: Never Used  Substance Use Topics   Alcohol use: Not Currently   Drug use: Never    Family History  Problem Relation Age of Onset   Breast cancer Neg Hx    Ovarian cancer Neg Hx         11/21/2022    8:14 AM 10/19/2022    2:07 PM  GAD 7 : Generalized Anxiety Score  Nervous, Anxious, on Edge 1 2  Control/stop worrying 0 0  Worry too much - different things 0 0  Trouble relaxing 1 0  Restless 0 0  Easily annoyed or irritable 2 1  Afraid - awful might happen 0 0  Total GAD 7 Score 4 3  Anxiety Difficulty Not difficult at all Not difficult at all       11/21/2022    8:14 AM 10/19/2022    2:07 PM  Depression screen PHQ 2/9  Decreased Interest 1 0  Down, Depressed, Hopeless 0 0  PHQ - 2 Score 1 0  Altered sleeping 1 0  Tired, decreased energy 1  0  Change in appetite 0 0  Feeling bad or failure about yourself  0 0  Trouble concentrating 0 0  Moving slowly or fidgety/restless 0 0  Suicidal thoughts 0 0  PHQ-9 Score 3 0  Difficult doing work/chores Not difficult at all Not difficult at all    BP Readings from Last 3 Encounters:  11/21/22 118/66  11/19/22 133/75  10/19/22 128/60    Wt Readings from Last 3 Encounters:  11/21/22 250 lb (113.4 kg)  11/19/22 253 lb 15.5 oz (115.2 kg)  10/19/22 254 lb (115.2 kg)    BP 118/66   Pulse (!) 106   Temp 98.7 F (37.1 C) (Oral)   Ht 5\' 1"  (1.549 m)   Wt 250 lb (113.4 kg)   SpO2 98%   BMI 47.24 kg/m   Physical Exam Vitals and nursing note reviewed.  Constitutional:      Appearance: Normal appearance.  Cardiovascular:     Rate and Rhythm: Normal rate and  regular rhythm.     Heart sounds: No murmur heard.    No friction rub. No gallop.  Pulmonary:     Effort: Pulmonary effort is normal.     Breath sounds: Normal breath sounds.  Abdominal:     General: Bowel sounds are decreased. There is no distension.     Tenderness: There is abdominal tenderness in the suprapubic area. There is no right CVA tenderness or left CVA tenderness.     Comments: Hypoactive bowel sounds, aortic pulse audible in the abdomen without pulsatile mass.  Musculoskeletal:        General: Normal range of motion.  Skin:    General: Skin is warm and dry.  Neurological:     Mental Status: She is alert and oriented to person, place, and time.     Gait: Gait is intact.  Psychiatric:        Mood and Affect: Mood and affect normal.     Recent Labs     Component Value Date/Time   NA 134 (L) 11/19/2022 1807   NA 139 10/19/2022 1433   NA 137 03/09/2013 1234   K 4.0 11/19/2022 1807   K 4.0 03/09/2013 1234   CL 101 11/19/2022 1807   CL 108 (H) 03/09/2013 1234   CO2 21 (L) 11/19/2022 1807   CO2 21 03/09/2013 1234   GLUCOSE 84 11/19/2022 1807   GLUCOSE 76 03/09/2013 1234   BUN 6 11/19/2022 1807   BUN 13 10/19/2022 1433   BUN 9 03/09/2013 1234   CREATININE 0.57 11/19/2022 1807   CREATININE 0.51 (L) 03/09/2013 1234   CALCIUM 8.8 (L) 11/19/2022 1807   CALCIUM 9.1 03/09/2013 1234   PROT 7.6 11/19/2022 1807   PROT 6.8 10/19/2022 1433   ALBUMIN 3.7 11/19/2022 1807   ALBUMIN 4.1 10/19/2022 1433   AST 16 11/19/2022 1807   AST 28 (H) 03/09/2013 1234   ALT 20 11/19/2022 1807   ALKPHOS 62 11/19/2022 1807   BILITOT 0.6 11/19/2022 1807   BILITOT <0.2 10/19/2022 1433   GFRNONAA >60 11/19/2022 1807   GFRNONAA >60 03/09/2013 1234   GFRAA >60 05/06/2019 2331   GFRAA >60 03/09/2013 1234    Lab Results  Component Value Date   WBC 13.8 (H) 11/19/2022   HGB 12.0 11/19/2022   HCT 38.4 11/19/2022   MCV 80.3 11/19/2022   PLT 312 11/19/2022   Lab Results  Component Value  Date   HGBA1C 5.7 10/03/2011   Lab Results  Component Value Date  CHOL 154 10/03/2011   HDL 55 10/03/2011   LDLCALC 84 10/03/2011   TRIG 75 10/03/2011   Lab Results  Component Value Date   TSH 1.680 10/19/2022     Assessment and Plan:  1. Acute cystitis without hematuria Patient advised to continue Keflex which seems to be working to treat her UTI  Incidental finding of audible abdominal pulse with stethoscope today.  Patient reassured likely only audible because of hypoactive bowel sounds and trapped gas from constipation.  I have reviewed images from her CT scan as well as the CT radiology report, no concern for any aortic pathology.  2. Current mild episode of major depressive disorder without prior episode (HCC) Continue paroxetine as below. - PARoxetine (PAXIL) 20 MG tablet; Take 1 tablet (20 mg total) by mouth daily.  Dispense: 90 tablet; Refill: 1   Follow-up as needed   Alvester Morin, PA-C, DMSc, Nutritionist [facility] Primary Care and Sports Medicine [facility] [phone]

## 2022-11-21 NOTE — Transitions of Care (Post Inpatient/ED Visit) (Signed)
   11/21/2022  Name: Ashley Austin MRN: [id number] DOB: 08/09/1995  Today's TOC FU Call Status: Today's TOC FU Call Status:: Unsuccessful Call (2nd Attempt) Unsuccessful Call (1st Attempt) Date: 11/20/22 Unsuccessful Call (2nd Attempt) Date: 11/21/22  Transition Care Management Follow-up Telephone Call Date of Discharge: 11/19/22 Discharge Facility: [facility]) Type of Discharge: Emergency Department Reason for ED Visit:  (abdominal pain) How have you been since you were released from the hospital?: Better Any questions or concerns?: No  Items Reviewed: Did you receive and understand the discharge instructions provided?: Yes Medications obtained,verified, and reconciled?: Yes (Medications Reviewed) Any new allergies since your discharge?: No Dietary orders reviewed?: No Do you have support at home?: Yes  Medications Reviewed Today: Medications Reviewed Today   Medications were not reviewed in this encounter     Home Care and Equipment/Supplies: Were Home Health Services Ordered?: No Any new equipment or medical supplies ordered?: No  Functional Questionnaire: Do you need assistance with bathing/showering or dressing?: No Do you need assistance with meal preparation?: No Do you need assistance with eating?: No Do you have difficulty maintaining continence: No Do you need assistance with getting out of bed/getting out of a chair/moving?: No Do you have difficulty managing or taking your medications?: No  Follow up appointments reviewed: PCP Follow-up appointment confirmed?: Yes Date of PCP follow-up appointment?: 11/21/22 Follow-up Provider: Alvester Morin Do you need transportation to your follow-up appointment?: No Do you understand care options if your condition(s) worsen?: Yes-patient verbalized understanding    SIGNATURE Maaz Spiering,CMA

## 2022-11-22 NOTE — Telephone Encounter (Signed)
Please review.  KP

## 2022-11-23 ENCOUNTER — Ambulatory Visit: Payer: BC Managed Care – PPO | Admitting: Physician Assistant

## 2022-11-26 ENCOUNTER — Encounter: Payer: Self-pay | Admitting: Physician Assistant

## 2022-11-26 ENCOUNTER — Telehealth: Payer: BC Managed Care – PPO | Admitting: Physician Assistant

## 2022-11-26 VITALS — Ht 61.0 in | Wt 250.0 lb

## 2022-11-26 DIAGNOSIS — B3731 Acute candidiasis of vulva and vagina: Secondary | ICD-10-CM

## 2022-11-26 MED ORDER — FLUCONAZOLE 150 MG PO TABS
150.0000 mg | ORAL_TABLET | ORAL | 0 refills | Status: AC
Start: 2022-11-26 — End: 2022-11-29

## 2022-11-26 NOTE — Progress Notes (Signed)
Date:  11/26/2022   Name:  Ashley Austin   DOB:  08-24-1995   MRN:  [id number]   I connected with Henderson Newcomer on 11/26/22 via MyChart Video and verified that I am speaking with the correct person using appropriate identifiers. The limitations, risks, security and privacy concerns of performing an evaluation and management service by MyChart Video, including the higher likelihood of inaccurate diagnoses and treatments, and the availability of in person appointments were reviewed. The possible need of an additional face-to-face encounter for complete and high quality delivery of care was discussed. The patient was also made aware that there may be a patient responsible charge related to this service. The patient expressed understanding and wishes to proceed.   Provider location is in medical facility [facility] Medicine [facility]). Patient location is at their home People involved in care of the patient during this telehealth encounter were myself, my CMA, and my front office/scheduling team member.    Chief Complaint: Vaginitis (X 3 days, itches , some pain, no discharge, pt has had a yeast infection before, burning when urinating, thinks its from antibiotics )  HPI Ashley Austin presents today via telehealth for evaluation of suspected vaginal yeast infection with symptom of mild vaginal itching and discomfort.  She has had only 1 yeast infection previously which was after taking antibiotics for a sinus infection.  She has nearly completed a round of cephalexin for UTI, takes her last dose today.   Medication list has been reviewed and updated.  Current Meds  Medication Sig   cephALEXin (KEFLEX) 500 MG capsule Take 1 capsule (500 mg total) by mouth 4 (four) times daily for 7 days.   fluconazole (DIFLUCAN) 150 MG tablet Take 1 tablet (150 mg total) by mouth as directed for 3 days. Take 1 tablet by mouth.  If no improvement after 72 hours, you may repeat dose  x1.   norgestrel-ethinyl estradiol (LO/OVRAL) 0.3-30 MG-MCG tablet Take 1 tablet by mouth daily. Take 21 days of active medication and skip the last 7 days of the pack and start a new pack   PARoxetine (PAXIL) 20 MG tablet Take 1 tablet (20 mg total) by mouth daily.     Review of Systems  Constitutional:  Negative for fatigue and fever.  Respiratory:  Negative for chest tightness and shortness of breath.   Cardiovascular:  Negative for chest pain and palpitations.  Gastrointestinal:  Negative for abdominal pain.  Genitourinary:  Positive for vaginal pain (mild, with itching). Negative for vaginal discharge.    Patient Active Problem List   Diagnosis Date Noted   Current episode of major depressive disorder without prior episode 10/27/2021   Class 3 severe obesity without serious comorbidity with body mass index (BMI) of 45.0 to 49.9 in adult (HCC) 10/27/2021    No Known Allergies  Immunization History  Administered Date(s) Administered   Influenza,inj,Quad PF,6+ Mos 06/16/2019, 12/29/2019   Tdap 05/10/2020   Varicella 07/17/2020    Past Surgical History:  Procedure Laterality Date   NO PAST SURGERIES      Social History   Tobacco Use   Smoking status: Never   Smokeless tobacco: Never  Vaping Use   Vaping status: Never Used  Substance Use Topics   Alcohol use: Not Currently   Drug use: Never    Family History  Problem Relation Age of Onset   Breast cancer Neg Hx    Ovarian cancer Neg Hx  11/26/2022    3:53 PM 11/21/2022    8:14 AM 10/19/2022    2:07 PM  GAD 7 : Generalized Anxiety Score  Nervous, Anxious, on Edge 1 1 2   Control/stop worrying 0 0 0  Worry too much - different things 0 0 0  Trouble relaxing 1 1 0  Restless 0 0 0  Easily annoyed or irritable 2 2 1   Afraid - awful might happen 0 0 0  Total GAD 7 Score 4 4 3   Anxiety Difficulty Not difficult at all Not difficult at all Not difficult at all       11/26/2022    3:53 PM 11/21/2022     8:14 AM 10/19/2022    2:07 PM  Depression screen PHQ 2/9  Decreased Interest 1 1 0  Down, Depressed, Hopeless 0 0 0  PHQ - 2 Score 1 1 0  Altered sleeping 1 1 0  Tired, decreased energy 1 1 0  Change in appetite 0 0 0  Feeling bad or failure about yourself  0 0 0  Trouble concentrating 0 0 0  Moving slowly or fidgety/restless 0 0 0  Suicidal thoughts 0 0 0  PHQ-9 Score 3 3 0  Difficult doing work/chores Not difficult at all Not difficult at all Not difficult at all    BP Readings from Last 3 Encounters:  11/21/22 118/66  11/19/22 133/75  10/19/22 128/60    Wt Readings from Last 3 Encounters:  11/26/22 250 lb (113.4 kg)  11/21/22 250 lb (113.4 kg)  11/19/22 253 lb 15.5 oz (115.2 kg)    Ht 5\' 1"  (1.549 m)   Wt 250 lb (113.4 kg)   BMI 47.24 kg/m   Physical Exam General: Speaking full sentences, no audible heavy breathing. Sounds alert and appropriately interactive. Well-appearing. Face symmetric. Extraocular movements intact. Pupils equal and round. No nasal flaring or accessory muscle use visualized.  Recent Labs     Component Value Date/Time   NA 134 (L) 11/19/2022 1807   NA 139 10/19/2022 1433   NA 137 03/09/2013 1234   K 4.0 11/19/2022 1807   K 4.0 03/09/2013 1234   CL 101 11/19/2022 1807   CL 108 (H) 03/09/2013 1234   CO2 21 (L) 11/19/2022 1807   CO2 21 03/09/2013 1234   GLUCOSE 84 11/19/2022 1807   GLUCOSE 76 03/09/2013 1234   BUN 6 11/19/2022 1807   BUN 13 10/19/2022 1433   BUN 9 03/09/2013 1234   CREATININE 0.57 11/19/2022 1807   CREATININE 0.51 (L) 03/09/2013 1234   CALCIUM 8.8 (L) 11/19/2022 1807   CALCIUM 9.1 03/09/2013 1234   PROT 7.6 11/19/2022 1807   PROT 6.8 10/19/2022 1433   ALBUMIN 3.7 11/19/2022 1807   ALBUMIN 4.1 10/19/2022 1433   AST 16 11/19/2022 1807   AST 28 (H) 03/09/2013 1234   ALT 20 11/19/2022 1807   ALKPHOS 62 11/19/2022 1807   BILITOT 0.6 11/19/2022 1807   BILITOT <0.2 10/19/2022 1433   GFRNONAA >60 11/19/2022 1807    GFRNONAA >60 03/09/2013 1234   GFRAA >60 05/06/2019 2331   GFRAA >60 03/09/2013 1234    Lab Results  Component Value Date   WBC 13.8 (H) 11/19/2022   HGB 12.0 11/19/2022   HCT 38.4 11/19/2022   MCV 80.3 11/19/2022   PLT 312 11/19/2022   Lab Results  Component Value Date   HGBA1C 5.7 10/03/2011   Lab Results  Component Value Date   CHOL 154 10/03/2011   HDL 55  10/03/2011   LDLCALC 84 10/03/2011   TRIG 75 10/03/2011   Lab Results  Component Value Date   TSH 1.680 10/19/2022     Assessment and Plan:  1. Vaginal candidiasis Sending fluconazole as below for presumed antibiotic induced vaginal candidiasis - fluconazole (DIFLUCAN) 150 MG tablet; Take 1 tablet (150 mg total) by mouth as directed for 3 days. Take 1 tablet by mouth.  If no improvement after 72 hours, you may repeat dose x1.  Dispense: 2 tablet; Refill: 0    F/u PRN  I discussed the above assessment and treatment plan with the patient. The patient was provided an opportunity to ask questions and all were answered. The patient agreed with the plan and demonstrated an understanding of the instructions. The patient was advised to call back or seek an in-person evaluation if the symptoms worsen or if the condition fails to improve as anticipated. I provided a total time of 7 minutes inclusive of time utilized for medical chart review, information gathering, care coordination with staff, and documentation completion.  Alvester Morin, PA-C, DMSc, Nutritionist [facility] [phone]

## 2022-11-28 ENCOUNTER — Encounter: Payer: Self-pay | Admitting: Physician Assistant

## 2022-11-28 ENCOUNTER — Ambulatory Visit (INDEPENDENT_AMBULATORY_CARE_PROVIDER_SITE_OTHER): Payer: BC Managed Care – PPO | Admitting: Physician Assistant

## 2022-11-28 VITALS — BP 112/78 | HR 78 | Temp 98.0°F | Wt 251.0 lb

## 2022-11-28 DIAGNOSIS — R509 Fever, unspecified: Secondary | ICD-10-CM | POA: Diagnosis not present

## 2022-11-28 DIAGNOSIS — J069 Acute upper respiratory infection, unspecified: Secondary | ICD-10-CM | POA: Diagnosis not present

## 2022-11-28 DIAGNOSIS — R059 Cough, unspecified: Secondary | ICD-10-CM | POA: Diagnosis not present

## 2022-11-28 LAB — POC COVID19 BINAXNOW: SARS Coronavirus 2 Ag: NEGATIVE

## 2022-11-28 LAB — POCT INFLUENZA A/B
Influenza A, POC: NEGATIVE
Influenza B, POC: NEGATIVE

## 2022-11-28 NOTE — Progress Notes (Signed)
Date:  11/28/2022   Name:  Ashley Austin   DOB:  15-Feb-1996   MRN:  [id number]   Chief Complaint: Cough (Left side shoulder hurts when she coughs, for 5 days)  Cough Associated symptoms include a fever (subjective). Pertinent negatives include no chest pain or shortness of breath.   [name] presents for evaluation of URI symptoms really worsening over the last 5 days including cough, nasal congestion, subjective fever, and intermittent bilateral shoulder aching.  She works in [employer].  No sick family members.  Recently seen for acute cystitis 11/21/2022 treated with cephalexin followed by virtual visit for suspected vaginal yeast infection 11/26/2022 treated with fluconazole.   Medication list has been reviewed and updated.  Current Meds  Medication Sig   fluconazole (DIFLUCAN) 150 MG tablet Take 1 tablet (150 mg total) by mouth as directed for 3 days. Take 1 tablet by mouth.  If no improvement after 72 hours, you may repeat dose x1.   norgestrel-ethinyl estradiol (LO/OVRAL) 0.3-30 MG-MCG tablet Take 1 tablet by mouth daily. Take 21 days of active medication and skip the last 7 days of the pack and start a new pack   PARoxetine (PAXIL) 20 MG tablet Take 1 tablet (20 mg total) by mouth daily.     Review of Systems  Constitutional:  Positive for fatigue and fever (subjective).  HENT:  Positive for congestion.   Respiratory:  Positive for cough. Negative for chest tightness and shortness of breath.   Cardiovascular:  Negative for chest pain and palpitations.  Gastrointestinal:  Negative for abdominal pain.  Musculoskeletal:  Positive for arthralgias.    Patient Active Problem List   Diagnosis Date Noted   Current episode of major depressive disorder without prior episode 10/27/2021   Class 3 severe obesity without serious comorbidity with body mass index (BMI) of 45.0 to 49.9 in adult (HCC) 10/27/2021    No Known Allergies  Immunization History  Administered Date(s)  Administered   Influenza,inj,Quad PF,6+ Mos 06/16/2019, 12/29/2019   Tdap 05/10/2020   Varicella 07/17/2020    Past Surgical History:  Procedure Laterality Date   NO PAST SURGERIES      Social History   Tobacco Use   Smoking status: Never   Smokeless tobacco: Never  Vaping Use   Vaping status: Never Used  Substance Use Topics   Alcohol use: Not Currently   Drug use: Never    Family History  Problem Relation Age of Onset   Breast cancer Neg Hx    Ovarian cancer Neg Hx         11/28/2022    2:56 PM 11/26/2022    3:53 PM 11/21/2022    8:14 AM 10/19/2022    2:07 PM  GAD 7 : Generalized Anxiety Score  Nervous, Anxious, on Edge 1 1 1 2   Control/stop worrying 1 0 0 0  Worry too much - different things 1 0 0 0  Trouble relaxing 0 1 1 0  Restless 0 0 0 0  Easily annoyed or irritable 1 2 2 1   Afraid - awful might happen 0 0 0 0  Total GAD 7 Score 4 4 4 3   Anxiety Difficulty Not difficult at all Not difficult at all Not difficult at all Not difficult at all       11/28/2022    2:56 PM 11/26/2022    3:53 PM 11/21/2022    8:14 AM  Depression screen PHQ 2/9  Decreased Interest 0 1 1  Down, Depressed, Hopeless  1 0 0  PHQ - 2 Score 1 1 1   Altered sleeping 0 1 1  Tired, decreased energy 1 1 1   Change in appetite 0 0 0  Feeling bad or failure about yourself  0 0 0  Trouble concentrating 0 0 0  Moving slowly or fidgety/restless 0 0 0  Suicidal thoughts 0 0 0  PHQ-9 Score 2 3 3   Difficult doing work/chores Not difficult at all Not difficult at all Not difficult at all    BP Readings from Last 3 Encounters:  11/28/22 112/78  11/21/22 118/66  11/19/22 133/75    Wt Readings from Last 3 Encounters:  11/28/22 251 lb (113.9 kg)  11/26/22 250 lb (113.4 kg)  11/21/22 250 lb (113.4 kg)    BP 112/78   Pulse 78   Temp 98 F (36.7 C)   Wt 251 lb (113.9 kg)   SpO2 99%   BMI 47.43 kg/m   Physical Exam Vitals and nursing note reviewed.  Constitutional:      General:  She is not in acute distress.    Appearance: Normal appearance.  HENT:     Right Ear: Tympanic membrane normal.     Left Ear: Tympanic membrane normal.     Ears:     Comments: EAC clear bilaterally with good view of TM which is without effusion or erythema.     Nose: Congestion present.     Comments: Sinuses nontender    Mouth/Throat:     Mouth: Mucous membranes are moist.     Pharynx: No oropharyngeal exudate or posterior oropharyngeal erythema.  Eyes:     Conjunctiva/sclera: Conjunctivae normal.     Pupils: Pupils are equal, round, and reactive to light.  Cardiovascular:     Rate and Rhythm: Normal rate and regular rhythm.     Heart sounds: No murmur heard.    No friction rub. No gallop.  Pulmonary:     Effort: Pulmonary effort is normal.     Breath sounds: Normal breath sounds. No wheezing, rhonchi or rales.  Lymphadenopathy:     Cervical: No cervical adenopathy.     Recent Labs     Component Value Date/Time   NA 134 (L) 11/19/2022 1807   NA 139 10/19/2022 1433   NA 137 03/09/2013 1234   K 4.0 11/19/2022 1807   K 4.0 03/09/2013 1234   CL 101 11/19/2022 1807   CL 108 (H) 03/09/2013 1234   CO2 21 (L) 11/19/2022 1807   CO2 21 03/09/2013 1234   GLUCOSE 84 11/19/2022 1807   GLUCOSE 76 03/09/2013 1234   BUN 6 11/19/2022 1807   BUN 13 10/19/2022 1433   BUN 9 03/09/2013 1234   CREATININE 0.57 11/19/2022 1807   CREATININE 0.51 (L) 03/09/2013 1234   CALCIUM 8.8 (L) 11/19/2022 1807   CALCIUM 9.1 03/09/2013 1234   PROT 7.6 11/19/2022 1807   PROT 6.8 10/19/2022 1433   ALBUMIN 3.7 11/19/2022 1807   ALBUMIN 4.1 10/19/2022 1433   AST 16 11/19/2022 1807   AST 28 (H) 03/09/2013 1234   ALT 20 11/19/2022 1807   ALKPHOS 62 11/19/2022 1807   BILITOT 0.6 11/19/2022 1807   BILITOT <0.2 10/19/2022 1433   GFRNONAA >60 11/19/2022 1807   GFRNONAA >60 03/09/2013 1234   GFRAA >60 05/06/2019 2331   GFRAA >60 03/09/2013 1234    Lab Results  Component Value Date   WBC 13.8 (H)  11/19/2022   HGB 12.0 11/19/2022   HCT 38.4 11/19/2022  MCV 80.3 11/19/2022   PLT 312 11/19/2022   Lab Results  Component Value Date   HGBA1C 5.7 10/03/2011   Lab Results  Component Value Date   CHOL 154 10/03/2011   HDL 55 10/03/2011   LDLCALC 84 10/03/2011   TRIG 75 10/03/2011   Lab Results  Component Value Date   TSH 1.680 10/19/2022     Assessment and Plan:  1. Acute URI COVID and flu testing negative today.  Essentially normal exam. Likely viral etiology. Discussed self-limited nature of viral illnesses and advised conservative measures including rest, fluids, and OTC cough/cold medications. Contact precautions advised to limit spread. Encouraged mask wearing and good hand hygiene especially before meals. Call if acutely worsening symptoms or if no improvement in 5 days   - POC COVID-19 - POCT Influenza A/B     Return if symptoms worsen or fail to improve.    Alvester Morin, PA-C, DMSc, Nutritionist [facility] [phone]

## 2022-11-29 NOTE — Telephone Encounter (Signed)
Please review.  KP

## 2022-11-30 ENCOUNTER — Ambulatory Visit (INDEPENDENT_AMBULATORY_CARE_PROVIDER_SITE_OTHER): Payer: BC Managed Care – PPO | Admitting: Physician Assistant

## 2022-11-30 VITALS — BP 104/78 | HR 86 | Temp 97.8°F | Ht 61.0 in | Wt 248.0 lb

## 2022-11-30 DIAGNOSIS — R109 Unspecified abdominal pain: Secondary | ICD-10-CM

## 2022-11-30 DIAGNOSIS — R1012 Left upper quadrant pain: Secondary | ICD-10-CM

## 2022-11-30 LAB — POCT URINALYSIS DIPSTICK
Bilirubin, UA: NEGATIVE
Glucose, UA: NEGATIVE
Ketones, UA: NEGATIVE
Leukocytes, UA: NEGATIVE
Nitrite, UA: NEGATIVE
Protein, UA: NEGATIVE
Spec Grav, UA: <=1.005 — AB (ref 1.010–1.025)
Urobilinogen, UA: 0.2 E.U./dL
pH, UA: 5 (ref 5.0–8.0)

## 2022-11-30 MED ORDER — OMEPRAZOLE 20 MG PO CPDR: 20.0000 mg | DELAYED_RELEASE_CAPSULE | Freq: Every day | ORAL | 2 refills | Status: DC

## 2022-11-30 MED ORDER — DICYCLOMINE HCL 20 MG PO TABS: 20.0000 mg | ORAL_TABLET | Freq: Four times a day (QID) | ORAL | 1 refills | Status: DC | PRN

## 2022-11-30 NOTE — Progress Notes (Addendum)
Date:  11/30/2022   Name:  Ashley Austin   DOB:  May 06, 1995   MRN:  [id number]   Chief Complaint: Abdominal Pain (L) upper quad radiating down the abdomen and into the back- on MP now)  HPI Ashley Austin returns today after last visit 2 days ago complaining of continued cramping left upper quadrant abdominal/flank pain.  She has been experiencing abdominal pain for about 2 weeks now, initially thought to be related to UTI which was treated with cephalexin, then constipation which has since resolved with magnesium citrate, but the abdominal pain persists.  She had CT of the abdomen 11/19/2022 which was normal aside from small bilateral renal cysts.  Importantly the location of the pain has been variable including central RLQ, RUQ, LUQ.  Last BM this morning of normal consistency and volume.  Has been taking Tylenol once daily and ibuprofen 400 mg every 8 hours as needed.  She reports that she is currently menstruating, but this does not feel like her typical menstrual cramps and is located much higher in the abdomen.  She describes the current pain as intermittent, sharp, lasts a few minutes before spontaneously resolving.  It is worse at night when lying supine, so she has stacked several pillows to sleep in a reclined position for the last few nights.  2 days ago I diagnosed her with viral URI, and she states that her cold symptoms are improving.   Medication list has been reviewed and updated.  Current Meds  Medication Sig   dicyclomine (BENTYL) 20 MG tablet Take 1 tablet (20 mg total) by mouth 4 (four) times daily as needed for spasms.   norgestrel-ethinyl estradiol (LO/OVRAL) 0.3-30 MG-MCG tablet Take 1 tablet by mouth daily. Take 21 days of active medication and skip the last 7 days of the pack and start a new pack   omeprazole (PRILOSEC) 20 MG capsule Take 1 capsule (20 mg total) by mouth daily.   PARoxetine (PAXIL) 20 MG tablet Take 1 tablet (20 mg total) by mouth daily.     Review of  Systems  Constitutional:  Negative for fatigue and fever.  Respiratory:  Negative for chest tightness and shortness of breath.   Cardiovascular:  Negative for chest pain and palpitations.  Gastrointestinal:  Positive for abdominal pain. Negative for blood in stool, constipation, diarrhea, nausea and vomiting.  Genitourinary:  Positive for flank pain. Negative for dysuria, hematuria and pelvic pain.    Patient Active Problem List   Diagnosis Date Noted   Current episode of major depressive disorder without prior episode 10/27/2021   Class 3 severe obesity without serious comorbidity with body mass index (BMI) of 45.0 to 49.9 in adult (HCC) 10/27/2021    No Known Allergies  Immunization History  Administered Date(s) Administered   Influenza,inj,Quad PF,6+ Mos 06/16/2019, 12/29/2019   Tdap 05/10/2020   Varicella 07/17/2020    Past Surgical History:  Procedure Laterality Date   NO PAST SURGERIES      Social History   Tobacco Use   Smoking status: Never   Smokeless tobacco: Never  Vaping Use   Vaping status: Never Used  Substance Use Topics   Alcohol use: Not Currently   Drug use: Never    Family History  Problem Relation Age of Onset   Breast cancer Neg Hx    Ovarian cancer Neg Hx         11/28/2022    2:56 PM 11/26/2022    3:53 PM 11/21/2022    8:14 AM  10/19/2022    2:07 PM  GAD 7 : Generalized Anxiety Score  Nervous, Anxious, on Edge 1 1 1 2   Control/stop worrying 1 0 0 0  Worry too much - different things 1 0 0 0  Trouble relaxing 0 1 1 0  Restless 0 0 0 0  Easily annoyed or irritable 1 2 2 1   Afraid - awful might happen 0 0 0 0  Total GAD 7 Score 4 4 4 3   Anxiety Difficulty Not difficult at all Not difficult at all Not difficult at all Not difficult at all       11/28/2022    2:56 PM 11/26/2022    3:53 PM 11/21/2022    8:14 AM  Depression screen PHQ 2/9  Decreased Interest 0 1 1  Down, Depressed, Hopeless 1 0 0  PHQ - 2 Score 1 1 1   Altered sleeping 0  1 1  Tired, decreased energy 1 1 1   Change in appetite 0 0 0  Feeling bad or failure about yourself  0 0 0  Trouble concentrating 0 0 0  Moving slowly or fidgety/restless 0 0 0  Suicidal thoughts 0 0 0  PHQ-9 Score 2 3 3   Difficult doing work/chores Not difficult at all Not difficult at all Not difficult at all    BP Readings from Last 3 Encounters:  11/30/22 104/78  11/28/22 112/78  11/21/22 118/66    Wt Readings from Last 3 Encounters:  11/30/22 248 lb (112.5 kg)  11/28/22 251 lb (113.9 kg)  11/26/22 250 lb (113.4 kg)    BP 104/78   Pulse 86   Temp 97.8 F (36.6 C) (Oral)   Ht 5\' 1"  (1.549 m)   Wt 248 lb (112.5 kg)   LMP 11/30/2022 (Exact Date)   SpO2 96%   BMI 46.86 kg/m   Physical Exam Vitals and nursing note reviewed.  Constitutional:      Appearance: Normal appearance.  Cardiovascular:     Rate and Rhythm: Normal rate.  Pulmonary:     Effort: Pulmonary effort is normal.  Abdominal:     General: Bowel sounds are increased. There is no distension.     Palpations: Abdomen is soft.     Tenderness: There is abdominal tenderness in the right lower quadrant and epigastric area. There is no right CVA tenderness or left CVA tenderness.     Comments: Interestingly the LUQ is not tender on exam today. No CVA tenderness.   Musculoskeletal:        General: Normal range of motion.  Skin:    General: Skin is warm and dry.  Neurological:     Mental Status: She is alert and oriented to person, place, and time.     Gait: Gait is intact.  Psychiatric:        Mood and Affect: Mood and affect normal.      Recent Labs     Component Value Date/Time   NA 134 (L) 11/19/2022 1807   NA 139 10/19/2022 1433   NA 137 03/09/2013 1234   K 4.0 11/19/2022 1807   K 4.0 03/09/2013 1234   CL 101 11/19/2022 1807   CL 108 (H) 03/09/2013 1234   CO2 21 (L) 11/19/2022 1807   CO2 21 03/09/2013 1234   GLUCOSE 84 11/19/2022 1807   GLUCOSE 76 03/09/2013 1234   BUN 6 11/19/2022 1807    BUN 13 10/19/2022 1433   BUN 9 03/09/2013 1234   CREATININE 0.57 11/19/2022 1807  CREATININE 0.51 (L) 03/09/2013 1234   CALCIUM 8.8 (L) 11/19/2022 1807   CALCIUM 9.1 03/09/2013 1234   PROT 7.6 11/19/2022 1807   PROT 6.8 10/19/2022 1433   ALBUMIN 3.7 11/19/2022 1807   ALBUMIN 4.1 10/19/2022 1433   AST 16 11/19/2022 1807   AST 28 (H) 03/09/2013 1234   ALT 20 11/19/2022 1807   ALKPHOS 62 11/19/2022 1807   BILITOT 0.6 11/19/2022 1807   BILITOT <0.2 10/19/2022 1433   GFRNONAA >60 11/19/2022 1807   GFRNONAA >60 03/09/2013 1234   GFRAA >60 05/06/2019 2331   GFRAA >60 03/09/2013 1234    Lab Results  Component Value Date   WBC 13.8 (H) 11/19/2022   HGB 12.0 11/19/2022   HCT 38.4 11/19/2022   MCV 80.3 11/19/2022   PLT 312 11/19/2022   Lab Results  Component Value Date   HGBA1C 5.7 10/03/2011   Lab Results  Component Value Date   CHOL 154 10/03/2011   HDL 55 10/03/2011   LDLCALC 84 10/03/2011   TRIG 75 10/03/2011   Lab Results  Component Value Date   TSH 1.680 10/19/2022     Assessment and Plan:  1. Left upper quadrant abdominal pain Patient reassured no red flag symptoms, no sign of UTI or kidney stone.    It is possible that this might respond to dicyclomine in the event that she has a spasmodic abdomen.  However true IBS unlikely given her normal bowel movements and well-controlled anxiety.  If no improvement with dicyclomine, treat for GERD with omeprazole.  Her cramping pain is not characteristic, but the epigastric pain on exam could suggest this as a cause. She denies acid symptoms at present, though she's felt this in the past.   - omeprazole (PRILOSEC) 20 MG capsule; Take 1 capsule (20 mg total) by mouth daily.  Dispense: 30 capsule; Refill: 2 - dicyclomine (BENTYL) 20 MG tablet; Take 1 tablet (20 mg total) by mouth 4 (four) times daily as needed for spasms.  Dispense: 30 tablet; Refill: 1  2. Left flank pain Dipstick normal aside from blood likely  explained by menses.  - POCT urinalysis dipstick   Return if symptoms worsen or fail to improve.    Alvester Morin, PA-C, DMSc, Nutritionist [facility] and [facility] Medicine [facility] [phone]

## 2022-11-30 NOTE — Telephone Encounter (Signed)
Pt response.  KP

## 2022-12-11 ENCOUNTER — Other Ambulatory Visit: Payer: Self-pay | Admitting: Physician Assistant

## 2022-12-11 DIAGNOSIS — R1084 Generalized abdominal pain: Secondary | ICD-10-CM

## 2022-12-11 NOTE — Telephone Encounter (Signed)
Please review.  KP

## 2022-12-12 NOTE — Progress Notes (Signed)
Celso Amy, PA-C [address]  [address]  [place], [place] [address]  Main: [phone]  Fax: [phone]   Gastroenterology Consultation  Referring Provider:     Remo Lipps, PA Primary Care Physician:  Remo Lipps, PA Primary Gastroenterologist:  Celso Amy, PA-C  Reason for Consultation:     Abdominal pain        HPI:   Ashley Austin is a 27 y.o. y/o female referred for consultation & management  by Remo Lipps, PA.    She went to [facility] ED 11/19/2022 with 4-day history of periumbilical, LUQ and RLQ abdominal pain.  Low-grade fever.  Denied having nausea, vomiting, diarrhea, dysuria, or urinary symptoms.  Labs 11/19/2022 showed normal CBC, CMP, and lipase except mildly elevated white count 13.8.  Normal hemoglobin 12.0.  Negative pregnancy test.  UA was positive for UTI and she was treated with Keflex for 7 days.  Abdominal pelvic CT 11/19/2022 showed no acute abnormality.  No previous GI evaluation.  She has not had any abdominal surgeries.  Current Symptoms: Patient states she is not having any more lower abdominal pain.  She has had some intermittent epigastric, left upper quadrant and left side abdominal pain in the past month.  Her PCP started her on dicyclomine 20 Mg 3 times daily which has greatly helped with LUQ abdominal cramping.  Abdominal pain has improved.  She states she has had intermittent constipation for 1 year.  She takes fiber and drinks lots of water with mild benefit.  No other treatment.  She denies diarrhea, rectal bleeding, or unintentional weight loss.  She has no heartburn or acid reflux.  She did not start omeprazole prescription that was given.    Past Medical History:  Diagnosis Date   Anxiety    Gestational hypertension    Postpartum hypertension 07/20/2020    Past Surgical History:  Procedure Laterality Date   NO PAST SURGERIES      Prior to Admission medications   Medication Sig Start Date End Date Taking?  Authorizing Provider  dicyclomine (BENTYL) 20 MG tablet Take 1 tablet (20 mg total) by mouth 4 (four) times daily as needed for spasms. 11/30/22   Remo Lipps, PA  norgestrel-ethinyl estradiol (LO/OVRAL) 0.3-30 MG-MCG tablet Take 1 tablet by mouth daily. Take 21 days of active medication and skip the last 7 days of the pack and start a new pack 10/27/21   Federico Flake, MD  PARoxetine (PAXIL) 20 MG tablet Take 1 tablet (20 mg total) by mouth daily. 11/21/22   Remo Lipps, PA    Family History  Problem Relation Age of Onset   Breast cancer Neg Hx    Ovarian cancer Neg Hx      Social History   Tobacco Use   Smoking status: Never   Smokeless tobacco: Never  Vaping Use   Vaping status: Never Used  Substance Use Topics   Alcohol use: Not Currently   Drug use: Never    Allergies as of 12/13/2022   (No Known Allergies)    Review of Systems:    All systems reviewed and negative except where noted in HPI.   Physical Exam:  BP 109/78   Pulse 98   Temp 98.2 F (36.8 C)   Ht 5' (1.524 m)   Wt 243 lb 9.6 oz (110.5 kg)   LMP 11/30/2022 (Exact Date)   BMI 47.57 kg/m  Patient's last menstrual period was 11/30/2022 (exact date).  General:  Alert,  Well-developed, obese, pleasant and cooperative in NAD Lungs:  Respirations even and unlabored.  Clear throughout to auscultation.   No wheezes, crackles, or rhonchi. No acute distress. Heart:  Regular rate and rhythm; no murmurs, clicks, rubs, or gallops. Abdomen:  Normal bowel sounds.  No bruits.  Soft, and non-distended without masses, hepatosplenomegaly or hernias noted.  No Tenderness at all today.  No guarding or rebound tenderness.    Neurologic:  Alert and oriented x3;  grossly normal neurologically. Psych:  Alert and cooperative. Normal mood and affect.  Imaging Studies: CT ABDOMEN PELVIS W CONTRAST  Result Date: 11/19/2022 CLINICAL DATA:  RLQ pain for several days. EXAM: CT ABDOMEN AND PELVIS WITH CONTRAST  TECHNIQUE: Multidetector CT imaging of the abdomen and pelvis was performed using the standard protocol following bolus administration of intravenous contrast. RADIATION DOSE REDUCTION: This exam was performed according to the departmental dose-optimization program which includes automated exposure control, adjustment of the mA and/or kV according to patient size and/or use of iterative reconstruction technique. CONTRAST:  OMNIPAQUE IOHEXOL 350 MG/ML SOLN COMPARISON:  None Available. FINDINGS: Lower chest: Lung bases are clear. No pericardial or pleural effusion. Hepatobiliary: No focal liver abnormality is seen. No gallstones, gallbladder wall thickening, or biliary dilatation. Pancreas: Unremarkable. No pancreatic ductal dilatation or surrounding inflammatory changes. Spleen: Normal in size without focal abnormality. Adrenals/Urinary Tract: Cyst on the right measures about 1.9 cm. Cyst on the left measures about 0.5 cm. No hydronephrosis or nephrolithiasis. No adrenal lesions. Unremarkable urinary bladder. Stomach/Bowel: Stomach is within normal limits. Appendix appears normal. No evidence of bowel wall thickening, distention, or inflammatory changes. Vascular/Lymphatic: No significant vascular findings are present. No enlarged abdominal or pelvic lymph nodes. Reproductive: Uterus and bilateral adnexa are unremarkable. Other: No abdominal wall hernia or abnormality. No abdominopelvic ascites. Musculoskeletal: No acute or significant osseous findings. IMPRESSION: 1. Small renal cysts. 2. No acute abdominal or pelvic pathology. Electronically Signed   By: Layla Maw M.D.   On: 11/19/2022 19:52    Assessment and Plan:   Ashley Austin is a 27 y.o. y/o female has been referred for:  1.  Abdominal cramping: Mostly LUQ; Relieved with Dicyclomine; Suspect due to Colon Spasm and Constipation;  Recent Abd / Pelvic CT and labs showed no acute abnormality.  Continue dicyclomine 20mg  TID as needed for  abdominal cramping.  Start MiraLAX for constipation  2.  Epigastric pain  H. pylori breath test  3.  Chronic idiopathic constipation  Start MiraLAX, mix 1 capful every once daily  High-fiber diet  64 ounces of fluids/water daily  Follow up based on H. pylori breath test.  Also follow-up if symptoms return.  Celso Amy, PA-C

## 2022-12-13 ENCOUNTER — Ambulatory Visit (INDEPENDENT_AMBULATORY_CARE_PROVIDER_SITE_OTHER): Payer: BC Managed Care – PPO | Admitting: Physician Assistant

## 2022-12-13 ENCOUNTER — Encounter: Payer: Self-pay | Admitting: Physician Assistant

## 2022-12-13 VITALS — BP 109/78 | HR 98 | Temp 98.2°F | Ht 60.0 in | Wt 243.6 lb

## 2022-12-13 DIAGNOSIS — K5904 Chronic idiopathic constipation: Secondary | ICD-10-CM

## 2022-12-13 DIAGNOSIS — R1012 Left upper quadrant pain: Secondary | ICD-10-CM

## 2022-12-13 DIAGNOSIS — R109 Unspecified abdominal pain: Secondary | ICD-10-CM

## 2022-12-13 DIAGNOSIS — R1013 Epigastric pain: Secondary | ICD-10-CM | POA: Diagnosis not present

## 2022-12-13 MED ORDER — DICYCLOMINE HCL 20 MG PO TABS: 20.0000 mg | ORAL_TABLET | Freq: Three times a day (TID) | ORAL | 2 refills | Status: DC | PRN

## 2022-12-14 LAB — H. PYLORI BREATH TEST: H pylori Breath Test: NEGATIVE

## 2023-01-03 ENCOUNTER — Other Ambulatory Visit: Payer: Self-pay | Admitting: Family Medicine

## 2023-01-03 DIAGNOSIS — Z3041 Encounter for surveillance of contraceptive pills: Secondary | ICD-10-CM

## 2023-01-14 ENCOUNTER — Ambulatory Visit: Payer: BC Managed Care – PPO | Admitting: Physician Assistant

## 2023-01-21 ENCOUNTER — Encounter: Payer: Self-pay | Admitting: Obstetrics and Gynecology

## 2023-01-21 NOTE — Telephone Encounter (Signed)
I contacted the patient via phone to follow up with the patient to confirm if she was going to the ER due to the confusion in the message sent. The patient states she will be follow up with the ER. I advised the patient to contact our office to scheduled follow up  care if needed after visit to ER.

## 2023-05-06 ENCOUNTER — Other Ambulatory Visit: Payer: Self-pay | Admitting: Physician Assistant

## 2023-05-06 DIAGNOSIS — Z3041 Encounter for surveillance of contraceptive pills: Secondary | ICD-10-CM

## 2023-05-06 MED ORDER — NORGESTREL-ETHINYL ESTRADIOL 0.3-30 MG-MCG PO TABS
1.0000 | ORAL_TABLET | Freq: Every day | ORAL | 5 refills | Status: DC
Start: 2023-05-06 — End: 2023-10-25

## 2023-05-06 NOTE — Telephone Encounter (Signed)
 Please review.  KP

## 2023-05-27 ENCOUNTER — Other Ambulatory Visit: Payer: Self-pay | Admitting: Physician Assistant

## 2023-05-27 DIAGNOSIS — F320 Major depressive disorder, single episode, mild: Secondary | ICD-10-CM

## 2023-05-27 MED ORDER — PAROXETINE HCL 20 MG PO TABS
20.0000 mg | ORAL_TABLET | Freq: Every day | ORAL | 1 refills | Status: DC
Start: 2023-05-27 — End: 2023-07-24

## 2023-05-29 ENCOUNTER — Ambulatory Visit (INDEPENDENT_AMBULATORY_CARE_PROVIDER_SITE_OTHER): Payer: BC Managed Care – PPO | Admitting: Physician Assistant

## 2023-05-29 VITALS — BP 118/84 | HR 87 | Temp 98.2°F | Ht 60.0 in | Wt 254.0 lb

## 2023-05-29 DIAGNOSIS — R6889 Other general symptoms and signs: Secondary | ICD-10-CM | POA: Diagnosis not present

## 2023-05-29 DIAGNOSIS — J069 Acute upper respiratory infection, unspecified: Secondary | ICD-10-CM | POA: Diagnosis not present

## 2023-05-29 LAB — POC COVID19 BINAXNOW: SARS Coronavirus 2 Ag: NEGATIVE

## 2023-05-29 LAB — POCT INFLUENZA A/B
Influenza A, POC: NEGATIVE
Influenza B, POC: NEGATIVE

## 2023-05-29 MED ORDER — BENZONATATE 200 MG PO CAPS
200.0000 mg | ORAL_CAPSULE | Freq: Three times a day (TID) | ORAL | 0 refills | Status: AC
Start: 2023-05-29 — End: 2023-06-08

## 2023-05-29 MED ORDER — PROMETHAZINE-DM 6.25-15 MG/5ML PO SYRP
5.0000 mL | ORAL_SOLUTION | Freq: Four times a day (QID) | ORAL | 0 refills | Status: AC | PRN
Start: 2023-05-29 — End: 2023-06-07

## 2023-05-29 NOTE — Progress Notes (Signed)
 Date:  05/29/2023   Name:  Ashley Austin   DOB:  Jan 15, 1996   MRN:  [id number]   Chief Complaint: URI  Cough This is a new problem. Episode onset: X4 days. The problem has been gradually worsening. The problem occurs hourly. The cough is Non-productive. Associated symptoms include chest pain, chills, myalgias, nasal congestion, rhinorrhea and sweats. Associated symptoms comments: Sinus pressure  . She has tried OTC cough suppressant and rest for the symptoms. The treatment provided mild relief.   Allison presents today for 7 days URI symptoms as above.  Her kids were sick with some type of cold early last week, did not see the pediatrician, now recovered.  Patient did not receive flu or COVID vaccines this season.  Currently using Tylenol Cold and Flu and NyQuil.  Cough is disruptive to sleep.   Medication list has been reviewed and updated.  Current Meds  Medication Sig   benzonatate (TESSALON) 200 MG capsule Take 1 capsule (200 mg total) by mouth 3 (three) times daily for 10 days.   norgestrel-ethinyl estradiol (LO/OVRAL) 0.3-30 MG-MCG tablet Take 1 tablet by mouth daily. Take 21 days of active medication and skip the last 7 days of the pack and start a new pack   PARoxetine (PAXIL) 20 MG tablet Take 1 tablet (20 mg total) by mouth daily.   promethazine-dextromethorphan (PROMETHAZINE-DM) 6.25-15 MG/5ML syrup Take 5 mLs by mouth 4 (four) times daily as needed for up to 9 days for cough.   [DISCONTINUED] dicyclomine (BENTYL) 20 MG tablet Take 1 tablet (20 mg total) by mouth 3 (three) times daily as needed for spasms.     Review of Systems  Constitutional:  Positive for chills.  HENT:  Positive for rhinorrhea.   Respiratory:  Positive for cough.   Cardiovascular:  Positive for chest pain.  Musculoskeletal:  Positive for myalgias.    Patient Active Problem List   Diagnosis Date Noted   Current episode of major depressive disorder without prior episode 10/27/2021   Class 3  severe obesity without serious comorbidity with body mass index (BMI) of 45.0 to 49.9 in adult (HCC) 10/27/2021    No Known Allergies  Immunization History  Administered Date(s) Administered   Influenza Inj Mdck Quad Pf 06/16/2021   Influenza,inj,Quad PF,6+ Mos 06/16/2019, 12/29/2019   Tdap 05/10/2020   Varicella 07/17/2020    Past Surgical History:  Procedure Laterality Date   NO PAST SURGERIES      Social History   Tobacco Use   Smoking status: Never   Smokeless tobacco: Never  Vaping Use   Vaping status: Never Used  Substance Use Topics   Alcohol use: Not Currently   Drug use: Never    Family History  Problem Relation Age of Onset   Breast cancer Neg Hx    Ovarian cancer Neg Hx         05/29/2023   11:11 AM 11/28/2022    2:56 PM 11/26/2022    3:53 PM 11/21/2022    8:14 AM  GAD 7 : Generalized Anxiety Score  Nervous, Anxious, on Edge 0 1 1 1   Control/stop worrying 0 1 0 0  Worry too much - different things 0 1 0 0  Trouble relaxing 0 0 1 1  Restless 0 0 0 0  Easily annoyed or irritable 0 1 2 2   Afraid - awful might happen 0 0 0 0  Total GAD 7 Score 0 4 4 4   Anxiety Difficulty Not difficult at all  Not difficult at all Not difficult at all Not difficult at all       05/29/2023   11:11 AM 11/28/2022    2:56 PM 11/26/2022    3:53 PM  Depression screen PHQ 2/9  Decreased Interest 0 0 1  Down, Depressed, Hopeless 0 1 0  PHQ - 2 Score 0 1 1  Altered sleeping 0 0 1  Tired, decreased energy 0 1 1  Change in appetite 0 0 0  Feeling bad or failure about yourself  0 0 0  Trouble concentrating 0 0 0  Moving slowly or fidgety/restless 0 0 0  Suicidal thoughts 0 0 0  PHQ-9 Score 0 2 3  Difficult doing work/chores Not difficult at all Not difficult at all Not difficult at all    BP Readings from Last 3 Encounters:  05/29/23 118/84  12/13/22 109/78  11/30/22 104/78    Wt Readings from Last 3 Encounters:  05/29/23 254 lb (115.2 kg)  12/13/22 243 lb 9.6 oz  (110.5 kg)  11/30/22 248 lb (112.5 kg)    BP 118/84   Pulse 87   Temp 98.2 F (36.8 C)   Ht 5' (1.524 m)   Wt 254 lb (115.2 kg)   SpO2 98%   BMI 49.61 kg/m   Physical Exam Vitals and nursing note reviewed.  Constitutional:      General: She is not in acute distress.    Appearance: Normal appearance.  HENT:     Right Ear: Tympanic membrane normal.     Left Ear: Tympanic membrane normal.     Ears:     Comments: EAC clear bilaterally with good view of TM which is without effusion or erythema.     Nose: Nose normal.     Comments: Sinuses nontender    Mouth/Throat:     Mouth: Mucous membranes are moist.     Pharynx: No oropharyngeal exudate or posterior oropharyngeal erythema.  Eyes:     Conjunctiva/sclera: Conjunctivae normal.     Pupils: Pupils are equal, round, and reactive to light.  Cardiovascular:     Rate and Rhythm: Normal rate and regular rhythm.     Heart sounds: No murmur heard.    No friction rub. No gallop.  Pulmonary:     Effort: Pulmonary effort is normal.     Breath sounds: Examination of the left-lower field reveals wheezing. Wheezing present. No rhonchi or rales.  Lymphadenopathy:     Cervical: No cervical adenopathy.     Recent Labs     Component Value Date/Time   NA 134 (L) 11/19/2022 1807   NA 139 10/19/2022 1433   NA 137 03/09/2013 1234   K 4.0 11/19/2022 1807   K 4.0 03/09/2013 1234   CL 101 11/19/2022 1807   CL 108 (H) 03/09/2013 1234   CO2 21 (L) 11/19/2022 1807   CO2 21 03/09/2013 1234   GLUCOSE 84 11/19/2022 1807   GLUCOSE 76 03/09/2013 1234   BUN 6 11/19/2022 1807   BUN 13 10/19/2022 1433   BUN 9 03/09/2013 1234   CREATININE 0.57 11/19/2022 1807   CREATININE 0.51 (L) 03/09/2013 1234   CALCIUM 8.8 (L) 11/19/2022 1807   CALCIUM 9.1 03/09/2013 1234   PROT 7.6 11/19/2022 1807   PROT 6.8 10/19/2022 1433   ALBUMIN 3.7 11/19/2022 1807   ALBUMIN 4.1 10/19/2022 1433   AST 16 11/19/2022 1807   AST 28 (H) 03/09/2013 1234   ALT 20  11/19/2022 1807   ALKPHOS 62 11/19/2022  1807   BILITOT 0.6 11/19/2022 1807   BILITOT <0.2 10/19/2022 1433   GFRNONAA >60 11/19/2022 1807   GFRNONAA >60 03/09/2013 1234   GFRAA >60 05/06/2019 2331   GFRAA >60 03/09/2013 1234    Lab Results  Component Value Date   WBC 13.8 (H) 11/19/2022   HGB 12.0 11/19/2022   HCT 38.4 11/19/2022   MCV 80.3 11/19/2022   PLT 312 11/19/2022   Lab Results  Component Value Date   HGBA1C 5.7 10/03/2011   Lab Results  Component Value Date   CHOL 154 10/03/2011   HDL 55 10/03/2011   LDLCALC 84 10/03/2011   TRIG 75 10/03/2011   Lab Results  Component Value Date   TSH 1.680 10/19/2022     Assessment and Plan:  1. Acute URI (Primary) Likely viral etiology. Discussed self-limited nature of viral illnesses and advised conservative measures including rest, fluids, honey, and OTC cough/cold medications.  Prescribing benzonatate and Promethazine DM for symptomatic relief.  Contact precautions advised to limit spread. Encouraged mask wearing and good hand hygiene especially before meals. Call if acutely worsening symptoms or if no improvement in the next 48h  - benzonatate (TESSALON) 200 MG capsule; Take 1 capsule (200 mg total) by mouth 3 (three) times daily for 10 days.  Dispense: 30 capsule; Refill: 0 - promethazine-dextromethorphan (PROMETHAZINE-DM) 6.25-15 MG/5ML syrup; Take 5 mLs by mouth 4 (four) times daily as needed for up to 9 days for cough.  Dispense: 118 mL; Refill: 0  2. Flu-like symptoms COVID/flu testing negative today. - POC COVID-19 BinaxNow - POCT Influenza A/B   No follow-ups on file.    Alvester Morin, PA-C, DMSc, Nutritionist [facility] Primary [facility] and [facility] [phone]

## 2023-07-24 ENCOUNTER — Other Ambulatory Visit: Payer: Self-pay | Admitting: Physician Assistant

## 2023-07-24 DIAGNOSIS — F320 Major depressive disorder, single episode, mild: Secondary | ICD-10-CM

## 2023-07-24 MED ORDER — PAROXETINE HCL 30 MG PO TABS: 30.0000 mg | ORAL_TABLET | Freq: Every day | ORAL | 1 refills | Status: DC

## 2023-07-24 NOTE — Telephone Encounter (Signed)
 Please review.  KP

## 2023-08-23 ENCOUNTER — Ambulatory Visit: Admitting: Physician Assistant

## 2023-09-22 ENCOUNTER — Other Ambulatory Visit: Payer: Self-pay | Admitting: Physician Assistant

## 2023-09-22 DIAGNOSIS — F320 Major depressive disorder, single episode, mild: Secondary | ICD-10-CM

## 2023-09-24 NOTE — Telephone Encounter (Signed)
 Requested by interface surescripts. Future visit in 1 month.  Requested Prescriptions  Pending Prescriptions Disp Refills   PARoxetine  (PAXIL ) 30 MG tablet [Pharmacy Med Name: PAROXETINE  HCL 30 MG TABLET] 30 tablet 0    Sig: TAKE 1 TABLET BY MOUTH EVERY DAY     Psychiatry:  Antidepressants - SSRI Failed - 09/24/2023  2:18 PM      Failed - Valid encounter within last 6 months    Recent Outpatient Visits           3 months ago Acute URI   [facility] at [facility], [place], [place]       Future Appointments             In 1 month [place], [place], [place] [facility] Medicine at [facility], [facility]            Passed - Completed PHQ-2 or PHQ-9 in the last 360 days

## 2023-10-25 ENCOUNTER — Ambulatory Visit

## 2023-10-25 ENCOUNTER — Ambulatory Visit (INDEPENDENT_AMBULATORY_CARE_PROVIDER_SITE_OTHER)

## 2023-10-25 ENCOUNTER — Encounter: Payer: Self-pay | Admitting: Physician Assistant

## 2023-10-25 VITALS — BP 112/74 | HR 95 | Wt 283.0 lb

## 2023-10-25 VITALS — BP 112/74 | HR 95 | Ht 61.0 in | Wt 283.0 lb

## 2023-10-25 DIAGNOSIS — Z3201 Encounter for pregnancy test, result positive: Secondary | ICD-10-CM

## 2023-10-25 DIAGNOSIS — Z3689 Encounter for other specified antenatal screening: Secondary | ICD-10-CM

## 2023-10-25 DIAGNOSIS — Z3200 Encounter for pregnancy test, result unknown: Secondary | ICD-10-CM

## 2023-10-25 DIAGNOSIS — Z8679 Personal history of other diseases of the circulatory system: Secondary | ICD-10-CM | POA: Insufficient documentation

## 2023-10-25 DIAGNOSIS — Z3480 Encounter for supervision of other normal pregnancy, unspecified trimester: Secondary | ICD-10-CM | POA: Insufficient documentation

## 2023-10-25 DIAGNOSIS — Z3687 Encounter for antenatal screening for uncertain dates: Secondary | ICD-10-CM

## 2023-10-25 DIAGNOSIS — O0990 Supervision of high risk pregnancy, unspecified, unspecified trimester: Secondary | ICD-10-CM | POA: Insufficient documentation

## 2023-10-25 LAB — POCT URINE PREGNANCY: Preg Test, Ur: POSITIVE — AB

## 2023-10-25 NOTE — Progress Notes (Signed)
 New OB Intake  I connected with  Ashley Austin on 10/25/23 at  9:15 AM EDT by in person.  I discussed the limitations, risks, security and privacy concerns of performing an evaluation and management service by telephone and the availability of in person appointments. I also discussed with the patient that there may be a patient responsible charge related to this service. The patient expressed understanding and agreed to proceed.  I explained I am completing New OB Intake today. We discussed her EDD of 06/13/24 that is based on LMP of 09/07/23. Pt is G3/P2. I reviewed her allergies, medications, Medical/Surgical/OB history, and appropriate screenings. There are cats in the home: no. Based on history, this is a/an pregnancy uncomplicated . Her obstetrical history is significant for N/A.  Patient Active Problem List   Diagnosis Date Noted   Supervision of other normal pregnancy, antepartum 10/25/2023   History of postpartum hypertension 10/25/2023   Current episode of major depressive disorder without prior episode 10/27/2021   Class 3 severe obesity without serious comorbidity with body mass index (BMI) of 45.0 to 49.9 in adult 10/27/2021    Concerns addressed today: None  Delivery Plans:  Plans to deliver at [facility].  Anatomy US  Explained first scheduled US  will be soon. Anatomy US  will be scheduled around [redacted] weeks gestational age.  Labs Discussed genetic screening with patient. Patient desires genetic testing to be drawn at new OB visit. Discussed possible labs to be drawn at new OB appointment.  COVID Vaccine Patient has had COVID vaccine.   Social Determinants of Health Food Insecurity: denies food insecurity Transportation: Patient denies transportation needs. Childcare: Discussed no children allowed at ultrasound appointments.   First visit review I reviewed new OB appt with pt. I explained she will have blood work and pap smear/pelvic exam if indicated.  Explained pt will be seen by an [facility] provider at first visit.  Beola Skeens, CMA 10/25/2023  9:31 AM

## 2023-10-25 NOTE — Patient Instructions (Signed)
 First Trimester of Pregnancy  The first trimester of pregnancy starts on the first day of your last monthly period until the end of week 13. This is months 1 through 3 of pregnancy. A week after a sperm fertilizes an egg, the egg will implant into the wall of the uterus and begin to develop into a baby. Body changes during your first trimester Your body goes through many changes during pregnancy. The changes usually return to normal after your baby is born. Physical changes Your breasts may grow larger and may hurt. The area around your nipples may get darker. Your periods will stop. Your hair and nails may grow faster. You may pee more often. Health changes You may tire easily. Your gums may bleed and may be sensitive when you brush and floss. You may not feel hungry. You may have heartburn. You may throw up or feel like you may throw up. You may want to eat some foods, but not others. You may have headaches. You may have trouble pooping (constipation). Other changes Your emotions may change from day to day. You may have more dreams. Follow these instructions at home: Medicines Talk to your health care provider if you're taking medicines. Ask if the medicines are safe to take during pregnancy. Your provider may change the medicines that you take. Do not take any medicines unless told to by your provider. Take a prenatal vitamin that has at least 600 micrograms (mcg) of folic acid. Do not use herbal medicines, illegal substances, or medicines that are not approved by your provider. Eating and drinking While you're pregnant your body needs extra food for your growing baby. Talk with your provider about what to eat while pregnant. Activity Most women are able to exercise during pregnancy. Exercises may need to change as your pregnancy goes on. Talk to your provider about your activities and exercise routines. Relieving pain and discomfort Wear a good, supportive bra if your breasts  hurt. Rest with your legs raised if you have leg cramps or low back pain. Safety Wear your seatbelt at all times when you're in a car. Talk to your provider if someone hits you, hurts you, or yells at you. Talk with your provider if you're feeling sad or have thoughts of hurting yourself. Lifestyle Certain things can be harmful while you're pregnant. Follow these rules: Do not use hot tubs, steam rooms, or saunas. Do not douche. Do not use tampons or scented pads. Do not drink alcohol,smoke, vape, or use products with nicotine or tobacco in them. If you need help quitting, talk with your provider. Avoid cat litter boxes and soil used by cats. These things carry germs that can cause harm to your pregnancy and your baby. General instructions Keep all follow-up visits. It helps you and your unborn baby stay as healthy as possible. Write down your questions. Take them to your visits. Your provider will: Talk with you about your overall health. Give you advice or refer you to specialists who can help with different needs, including: Prenatal education classes. Mental health and counseling. Foods and healthy eating. Ask for help if you need help with food. Call your dentist and ask to be seen. Brush your teeth with a soft toothbrush. Floss gently. Where to find more information American Pregnancy Association: americanpregnancy.org [employer]: acog.org Office on [employer] Health: [employer] Contact a health care provider if: You feel dizzy, faint, or have a fever. You vomit or have watery poop (diarrhea) for 2  days or more. You have abnormal discharge or bleeding from your vagina. You have pain when you pee or your pee smells bad. You have cramps, pain, or pressure in your belly area. Get help right away if: You have trouble breathing or chest pain. You have any kind of injury, such as from a fall or a car crash. These symptoms may be an  emergency. Get help right away. Call 911. Do not wait to see if the symptoms will go away. Do not drive yourself to the hospital. This information is not intended to replace advice given to you by your health care provider. Make sure you discuss any questions you have with your health care provider. Document Revised: 12/20/2022 Document Reviewed: 07/20/2022 Elsevier Patient Education  2024 Elsevier Inc.   Common Medications Safe in Pregnancy  Acne:      Constipation:  Benzoyl Peroxide     Colace  Clindamycin      Dulcolax Suppository  Topica Erythromycin     Fibercon  Salicylic Acid      Metamucil         Miralax AVOID:        Senakot   Accutane    Cough:  Retin-A       Cough Drops  Tetracycline      Phenergan w/ Codeine if Rx  Minocycline      Robitussin (Plain & DM)  Antibiotics:     Crabs/Lice:  Ceclor       RID  Cephalosporins    AVOID:  E-Mycins      Kwell  Keflex  Macrobid/Macrodantin   Diarrhea:  Penicillin      Kao-Pectate  Zithromax      Imodium AD         PUSH FLUIDS AVOID:       Cipro     Fever:  Tetracycline      Tylenol (Regular or Extra  Minocycline       Strength)  Levaquin      Extra Strength-Do not          Exceed 8 tabs/24 hrs Caffeine:        200mg /day (equiv. To 1 cup of coffee or  approx. 3 12 oz sodas)         Gas: Cold/Hayfever:       Gas-X  Benadryl      Mylicon  Claritin       Phazyme  **Claritin-D        Chlor-Trimeton    Headaches:  Dimetapp      ASA-Free Excedrin  Drixoral-Non-Drowsy     Cold Compress  Mucinex (Guaifenasin)     Tylenol (Regular or Extra  Sudafed/Sudafed-12 Hour     Strength)  **Sudafed PE Pseudoephedrine   Tylenol Cold & Sinus     Vicks Vapor Rub  Zyrtec  **AVOID if Problems With Blood Pressure         Heartburn: Avoid lying down for at least 1 hour after meals  Aciphex      Maalox     Rash:  Milk of Magnesia     Benadryl    Mylanta       1% Hydrocortisone Cream  Pepcid  Pepcid Complete   Sleep  Aids:  Prevacid      Ambien   Prilosec       Benadryl  Rolaids       Chamomile Tea  Tums (Limit 4/day)     Unisom  Tylenol PM         Warm milk-add vanilla or  Hemorrhoids:       Sugar for taste  Anusol/Anusol H.C.  (RX: Analapram 2.5%)  Sugar Substitutes:  Hydrocortisone OTC     Ok in moderation  Preparation H      Tucks        Vaseline lotion applied to tissue with wiping    Herpes:     Throat:  Acyclovir      Oragel  Famvir  Valtrex     Vaccines:         Flu Shot Leg Cramps:       *Gardasil  Benadryl      Hepatitis A         Hepatitis B Nasal Spray:       Pneumovax  Saline Nasal Spray     Polio Booster         Tetanus Nausea:       Tuberculosis test or PPD  Vitamin B6 25 mg TID   AVOID:    Dramamine      *Gardasil  Emetrol       Live Poliovirus  Ginger Root 250 mg QID    MMR (measles, mumps &  High Complex Carbs @ Bedtime    rebella)  Sea Bands-Accupressure    Varicella (Chickenpox)  Unisom 1/2 tab TID     *No known complications           If received before Pain:         Known pregnancy;   Darvocet       Resume series after  Lortab        Delivery  Percocet    Yeast:   Tramadol      Femstat  Tylenol 3      Gyne-lotrimin  Ultram       Monistat  Vicodin           MISC:         All Sunscreens           Hair Coloring/highlights          Insect Repellant's          (Including DEET)         Mystic Tans   Commonly Asked Questions During Pregnancy   Cats: A parasite can be excreted in cat feces.  To avoid exposure you need to have another person empty the little box.  If you must empty the litter box you will need to wear gloves.  Wash your hands after handling your cat.  This parasite can also be found in raw or undercooked meat so this should also be avoided.  Colds, Sore Throats, Flu: Please check your medication sheet to see what you can take for symptoms.  If your symptoms are unrelieved by these medications please call the office.  Dental Work: Most  any dental work Agricultural consultant recommends is permitted.  X-rays should only be taken during the first trimester if absolutely necessary.  Your abdomen should be shielded with a lead apron during all x-rays.  Please notify your provider prior to receiving any x-rays.  Novocaine is fine; gas is not recommended.  If your dentist requires a note from [place] prior to dental work please call the office and we will provide one for you.  Exercise: Exercise is an important part of staying healthy during your pregnancy.  You may continue most exercises you were accustomed to prior to pregnancy.  Later in your pregnancy you will most likely notice you have difficulty with activities requiring balance like riding a bicycle.  It is important that you listen to your body and avoid activities that put you at a higher risk of falling.  Adequate rest and staying well hydrated are a must!  If you have questions about the safety of specific activities ask your provider.    Exposure to Children with illness: Try to avoid obvious exposure; report any symptoms to [place] when noted,  If you have chicken pos, red measles or mumps, you should be immune to these diseases.   Please do not take any vaccines while pregnant unless you have checked with your OB provider.  Fetal Movement: After 28 weeks we recommend you do "kick counts" twice daily.  Lie or sit down in a calm quiet environment and count your baby movements "kicks".  You should feel your baby at least 10 times per hour.  If you have not felt 10 kicks within the first hour get up, walk around and have something sweet to eat or drink then repeat for an additional hour.  If count remains less than 10 per hour notify your provider.  Fumigating: Follow your pest control agent's advice as to how long to stay out of your home.  Ventilate the area well before re-entering.  Hemorrhoids:   Most over-the-counter preparations can be used during pregnancy.  Check your medication to see what is  safe to use.  It is important to use a stool softener or fiber in your diet and to drink lots of liquids.  If hemorrhoids seem to be getting worse please call the office.   Hot Tubs:  Hot tubs Jacuzzis and saunas are not recommended while pregnant.  These increase your internal body temperature and should be avoided.  Intercourse:  Sexual intercourse is safe during pregnancy as long as you are comfortable, unless otherwise advised by your provider.  Spotting may occur after intercourse; report any bright red bleeding that is heavier than spotting.  Labor:  If you know that you are in labor, please go to the hospital.  If you are unsure, please call the office and let us help you decide what to do.  Lifting, straining, etc:  If your job requires heavy lifting or straining please check with your provider for any limitations.  Generally, you should not lift items heavier than that you can lift simply with your hands and arms (no back muscles)  Painting:  Paint fumes do not harm your pregnancy, but may make you ill and should be avoided if possible.  Latex or water based paints have less odor than oils.  Use adequate ventilation while painting.  Permanents & Hair Color:  Chemicals in hair dyes are not recommended as they cause increase hair dryness which can increase hair loss during pregnancy.  " Highlighting" and permanents are allowed.  Dye may be absorbed differently and permanents may not hold as well during pregnancy.  Sunbathing:  Use a sunscreen, as skin burns easily during pregnancy.  Drink plenty of fluids; avoid over heating.  Tanning Beds:  Because their possible side effects are still unknown, tanning beds are not recommended.  Ultrasound Scans:  Routine ultrasounds are performed at approximately 20 weeks.  You will be able to see your baby's general anatomy an if you would like to know the gender this can usually be determined as well.  If it is questionable when you conceived you may  also  receive an ultrasound early in your pregnancy for dating purposes.  Otherwise ultrasound exams are not routinely performed unless there is a medical necessity.  Although you can request a scan we ask that you pay for it when conducted because insurance does not cover " patient request" scans.  Work: If your pregnancy proceeds without complications you may work until your due date, unless your physician or employer advises otherwise.  Round Ligament Pain/Pelvic Discomfort:  Sharp, shooting pains not associated with bleeding are fairly common, usually occurring in the second trimester of pregnancy.  They tend to be worse when standing up or when you remain standing for long periods of time.  These are the result of pressure of certain pelvic ligaments called "round ligaments".  Rest, Tylenol and heat seem to be the most effective relief.  As the womb and fetus grow, they rise out of the pelvis and the discomfort improves.  Please notify the office if your pain seems different than that described.  It may represent a more serious condition.

## 2023-10-25 NOTE — Progress Notes (Signed)
    NURSE VISIT NOTE  Subjective:    Patient ID: Ashley Austin, female    DOB: 1995/10/18, 28 y.o.   MRN: [id number]  HPI  Patient is a 28 y.o. G15P2002 female who presents for evaluation of amenorrhea. She believes she could be pregnant. Pregnancy is desired. Current symptoms also include: breast tenderness, fatigue, frequent urination, morning sickness, nausea, and positive home pregnancy test. Last period was normal.    Objective:    BP 112/74   Pulse 95   Ht 5' 1 (1.549 m)   Wt 283 lb (128.4 kg)   LMP 09/07/2023   BMI 53.47 kg/m   Lab Review  Results for orders placed or performed in visit on 10/25/23  POCT urine pregnancy  Result Value Ref Range   Preg Test, Ur Positive (A) Negative    Assessment:   1. Possible pregnancy, not confirmed     Plan:   Pregnancy Test: Positive  Estimated Date of Delivery: None noted. BP Cuff Measurement taken.  Encouraged well-balanced diet, plenty of rest when needed, pre-natal vitamins daily and walking for exercise.  Discussed self-help for nausea, avoiding OTC medications until consulting provider or pharmacist, other than Tylenol  as needed, minimal caffeine (1-2 cups daily) and avoiding alcohol.   She will schedule her nurse visit @ 7-[redacted] wks pregnant, u/s for dating @10  wk, and NOB visit at [redacted] wk pregnant.    Feel free to call with any questions.    Ehtan Delfavero H Kielyn Kardell, CMA

## 2023-10-29 ENCOUNTER — Other Ambulatory Visit: Payer: Self-pay | Admitting: Physician Assistant

## 2023-10-29 DIAGNOSIS — F320 Major depressive disorder, single episode, mild: Secondary | ICD-10-CM

## 2023-10-30 NOTE — Telephone Encounter (Signed)
 Patient not taking:  Reported on 10/25/2023.  Requested Prescriptions  Pending Prescriptions Disp Refills   PARoxetine  (PAXIL ) 30 MG tablet [Pharmacy Med Name: PAROXETINE  HCL 30 MG TABLET] 30 tablet 0    Sig: TAKE 1 TABLET BY MOUTH EVERY DAY     Psychiatry:  Antidepressants - SSRI Passed - 10/30/2023  9:35 AM      Passed - Completed PHQ-2 or PHQ-9 in the last 360 days      Passed - Valid encounter within last 6 months    Recent Outpatient Visits           5 months ago Acute URI   [facility] at [facility], [place], [place]

## 2023-11-22 ENCOUNTER — Ambulatory Visit

## 2023-11-22 DIAGNOSIS — Z3687 Encounter for antenatal screening for uncertain dates: Secondary | ICD-10-CM

## 2023-11-22 DIAGNOSIS — Z3A09 9 weeks gestation of pregnancy: Secondary | ICD-10-CM

## 2023-12-09 ENCOUNTER — Encounter: Payer: Self-pay | Admitting: Licensed Practical Nurse

## 2023-12-13 ENCOUNTER — Encounter: Payer: Self-pay | Admitting: Licensed Practical Nurse

## 2023-12-13 ENCOUNTER — Other Ambulatory Visit (HOSPITAL_COMMUNITY)
Admission: RE | Admit: 2023-12-13 | Discharge: 2023-12-13 | Disposition: A | Discharge status: Home / Self Care | Source: Ambulatory Visit | Attending: Licensed Practical Nurse | Admitting: Licensed Practical Nurse

## 2023-12-13 ENCOUNTER — Ambulatory Visit: Admitting: Licensed Practical Nurse

## 2023-12-13 VITALS — BP 118/59 | HR 91 | Wt 278.2 lb

## 2023-12-13 DIAGNOSIS — Z3A13 13 weeks gestation of pregnancy: Secondary | ICD-10-CM | POA: Diagnosis not present

## 2023-12-13 DIAGNOSIS — Z113 Encounter for screening for infections with a predominantly sexual mode of transmission: Secondary | ICD-10-CM

## 2023-12-13 DIAGNOSIS — Z0283 Encounter for blood-alcohol and blood-drug test: Secondary | ICD-10-CM

## 2023-12-13 DIAGNOSIS — Z23 Encounter for immunization: Secondary | ICD-10-CM

## 2023-12-13 DIAGNOSIS — Z124 Encounter for screening for malignant neoplasm of cervix: Secondary | ICD-10-CM | POA: Insufficient documentation

## 2023-12-13 DIAGNOSIS — Z3481 Encounter for supervision of other normal pregnancy, first trimester: Secondary | ICD-10-CM

## 2023-12-13 DIAGNOSIS — Z1379 Encounter for other screening for genetic and chromosomal anomalies: Secondary | ICD-10-CM

## 2023-12-13 DIAGNOSIS — Z3480 Encounter for supervision of other normal pregnancy, unspecified trimester: Secondary | ICD-10-CM | POA: Diagnosis not present

## 2023-12-13 DIAGNOSIS — Z131 Encounter for screening for diabetes mellitus: Secondary | ICD-10-CM

## 2023-12-13 MED ORDER — ASPIRIN 81 MG PO CHEW: 162.0000 mg | CHEWABLE_TABLET | Freq: Every day | ORAL | Status: AC

## 2023-12-13 NOTE — Patient Instructions (Addendum)
 Pap Test: What to Know Why am I having this test? A Pap test, also called a Pap smear, is a screening test to check for signs of: Infection. Cancer of the cervix. The cervix is the lowest part of the uterus. Precancerous changes. These are changes that may be a sign that cancer is developing. Females need this test regularly. In general, you should have a Pap test every 3 years until you reach menopause or you are 29 years old. If you are 40-20 years old you may choose to have their Pap test done at the same time as an human papillomavirus (HPV) test every 5 years instead of every 3 years. Your health care provider may recommend having Pap tests more or less often depending on your medical conditions and past Pap test results. What is being tested? Cervical cells are tested for signs of infection or abnormalities. What kind of sample is taken?  Your provider will collect a sample of cells from the surface of your cervix. This will be done using a small cotton swab, plastic spatula, or brush that is inserted into your vagina using a tool called a speculum. This sample is often collected during a pelvic exam, when you are lying on your back on an exam table with your feet in footrests, called stirrups. In some cases, fluids (secretions) from the cervix or vagina may also be collected. How do I prepare for this test? Know where you are in your menstrual cycle. If you're menstruating on the day of the test, you may be asked to reschedule. You may need to reschedule if you have a known vaginal infection on the day of the test. Follow instructions from your provider about: Changing or stopping your regular medicines. Some medicines, such as vaginal medicines and tetracycline, can cause abnormal test results. Avoiding douching 2-3 days before or the day of the test. Tell a health care provider about: Any allergies you have. All medicines you take. These include vitamins, herbs, eye drops, and  creams. Any bleeding problems you have. Any surgeries you've had. Any medical problems you have. Whether you're pregnant or may be pregnant. How are the results reported? Your test results will be reported as either abnormal or normal. What do the results mean? A normal test result means that you do not have signs of cancer of the cervix. An abnormal result may mean that you have: Cancer. A Pap test by itself is not enough to diagnose cancer. You will have more tests done if cancer is suspected. Precancerous changes in your cervix. Inflammation of the cervix. A sexually transmitted infection (STI). A fungal infection. An infection from a parasite. Talk with your provider about what your results mean. More tests may be needed. Questions to ask your health care provider Ask your provider, or the department that is doing the test: When will my results be ready? How will I get my results? What are my treatment options? What other tests do I need? What are my next steps? This information is not intended to replace advice given to you by your health care provider. Make sure you discuss any questions you have with your health care provider. Document Revised: 06/08/2023 Document Reviewed: 06/08/2023 Elsevier Patient Education  2025 Elsevier Inc.Second Trimester of Pregnancy  The second trimester of pregnancy is from week 14 through week 27. This is months 4 through 6 of pregnancy. During the second trimester: Morning sickness is less or has stopped. You may have more energy. You may  feel hungry more often. At this time, your unborn baby is growing very fast. At the end of the sixth month, the unborn baby may be up to 12 inches long and weigh about 1 pounds. You will likely start to feel the baby move between 16 and 20 weeks of pregnancy. Body changes during your second trimester Your body continues to change during this time. The changes usually go away after your baby is born. Physical  changes You will gain more weight. Your belly will get bigger. You may begin to get stretch marks on your hips, belly, and breasts. Your breasts will keep growing and may hurt. You may get dark spots or blotches on your face. A dark line from your belly button to the pubic area may appear. This line is called linea nigra. Your hair may grow faster and get thicker. Health changes You may have headaches. You may have heartburn. You may pee more often. You may have swollen, bulging veins (varicose veins). You may have trouble pooping (constipation), or swollen veins in the butt that can itch or get painful (hemorrhoids). You may have back pain. This is caused by: Weight gain. Pregnancy hormones that are relaxing the joints in your pelvis. Follow these instructions at home: Medicines Talk to your health care provider if you're taking medicines. Ask if the medicines are safe to take during pregnancy. Your provider may change the medicines that you take. Do not take any medicines unless told to by your provider. Take a prenatal vitamin that has at least 600 micrograms (mcg) of folic acid . Do not use herbal medicines, illegal drugs, or medicines that are not approved by your provider. Eating and drinking While you're pregnant your body needs extra food for your growing baby. Talk with your provider about what to eat while pregnant. Activity Most women are able to exercise during pregnancy. Exercises may need to change as your pregnancy goes on. Talk to your provider about your activities and exercise routines. Relieving pain and discomfort Wear a good, supportive bra if your breasts hurt. Rest with your legs raised if you have leg cramps or low back pain. Take warm sitz baths to soothe pain from hemorrhoids. Use hemorrhoid cream if your provider says it's okay. Do not douche. Do not use tampons or scented pads. Do not use hot tubs, steam rooms, or saunas. Safety Wear your seatbelt at all  times when you're in a car. Talk to your provider if someone hits you, hurts you, or yells at you. Talk with your provider if you're feeling sad or have thoughts of hurting yourself. Lifestyle Certain things can be harmful while you're pregnant. It's best to avoid the following: Do not drink alcohol,smoke, vape, or use products with nicotine or tobacco in them. If you need help quitting, talk with your provider. Avoid cat litter boxes and soil used by cats. These things carry germs that can cause harm to your pregnancy and your baby. General instructions Keep all follow-up visits. It helps you and your unborn baby stay as healthy as possible. Write down your questions. Take them to your prenatal visits. Your provider will: Talk with you about your overall health. Give you advice or refer you to specialists who can help with different needs, including: Prenatal education classes. Mental health and counseling. Foods and healthy eating. Ask for help if you need help with food. Where to find more information American Pregnancy Association: americanpregnancy.org [employer]: acog.org Office on [employer] Health: [employer]  Contact a health care provider if: You have a headache that does not go away when you take medicine. You have any of these problems: You can't eat or drink. You throw up or feel like you may throw up. You have watery poop (diarrhea) for 2 days or more. You have pain when you pee or your pee smells bad. You have been sick for 2 days or more and are not getting better. Contact your provider right away if: You have any of these coming from your vagina: Abnormal discharge. Bad-smelling fluid. Bleeding. Your baby is moving less than usual. You have contractions, belly cramping, or have pain in your pelvis or lower back. You have symptoms of high blood pressure or preeclampsia. These include: A severe, throbbing headache that does  not go away. Sudden or extreme swelling of your face, hands, legs, or feet. Vision problems: You see spots. You have blurry vision. Your eyes are sensitive to light. If you can't reach the provider, go to an urgent care or emergency room. Get help right away if: You faint, become confused, or can't think clearly. You have chest pain or trouble breathing. You have any kind of injury, such as from a fall or a car crash. These symptoms may be an emergency. Call 911 right away. Do not wait to see if the symptoms will go away. Do not drive yourself to the hospital. This information is not intended to replace advice given to you by your health care provider. Make sure you discuss any questions you have with your health care provider. Document Revised: 12/20/2022 Document Reviewed: 07/20/2022 Elsevier Patient Education  2024 [employer].

## 2023-12-13 NOTE — Progress Notes (Addendum)
 NEW OB HISTORY AND PHYSICAL  SUBJECTIVE:       Ashley Austin is a 28 y.o. G62P2002 female, Patient's last menstrual period was 09/07/2023 (exact date)., Estimated Date of Delivery: 06/13/24, [redacted]w[redacted]d, presents today for establishment of Prenatal Care.  This pregnancy was not planned, she missed 2 days of her pills.  She is feeling good about the pregnancy, ' She had an US  on 8/22 that showed an SIUP at [redacted]w[redacted]d  She reports nausea ands fatigue   Social history Partner/Relationship:partnered  Living situation: Lives with her S.O. Lupe and her 2 children ages 35 and 42. This will be his first baby  Work: Sales executive  Exercise: Tries to walk  Substance use: denies tobacco/nicotine , alcohol or Illicit drugs  Dental exam up to date    HX of Anxiety: stopped Paxil  once pregnant, feels that she is doing well  SVB 2, PP preeclampsia with first child, gained a lot of wt her first pregnancy and then 15-20 with second, had PP anxiety with [place] child, bottle fed, plans to bottle feed    Indications for ASA therapy (per uptodate) One of the following: Previous pregnancy with preeclampsia, especially early onset and with an adverse outcome Yes Multifetal gestation No Chronic hypertension No Type 1 or 2 diabetes mellitus No Chronic kidney disease No Autoimmune disease (antiphospholipid syndrome, systemic lupus erythematosus) No  Two or more of the following: Nulliparity No Obesity (body mass index >30 kg/m2) Yes Family history of preeclampsia in mother or sister No Age >=35 years No Sociodemographic characteristics (African American race, low socioeconomic level) No Personal risk factors (eg, previous pregnancy with low birth weight or small for gestational age infant, previous adverse pregnancy outcome [eg, stillbirth], interval >10 years between pregnancies) No   Gynecologic History Patient's last menstrual period was 09/07/2023 (exact date). Normal Contraception: pills Last Pap: 2022  Results were: normal  Obstetric History OB History  Gravida Para Term Preterm AB Living  3 2 2   2   SAB IAB Ectopic Multiple Live Births     0 2    # Outcome Date GA Lbr Len/2nd Weight Sex Type Anes PTL Lv  3 Current           2 Term 07/16/20 [redacted]w[redacted]d / 00:11 6 lb 13.7 oz (3.11 kg) M Vag-Spont EPI  LIV  1 Term 03/11/13 [redacted]w[redacted]d  6 lb 4 oz (2.835 kg) M Vag-Spont       Past Medical History:  Diagnosis Date   Anxiety    Gestational hypertension    Postpartum hypertension 07/20/2020    Past Surgical History:  Procedure Laterality Date   NO PAST SURGERIES      Current Outpatient Medications on File Prior to Visit  Medication Sig Dispense Refill   PARoxetine  (PAXIL ) 30 MG tablet TAKE 1 TABLET BY MOUTH EVERY DAY (Patient not taking: Reported on 10/25/2023) 30 tablet 0   No current facility-administered medications on file prior to visit.    No Known Allergies  Social History   Socioeconomic History   Marital status: Significant Other    Spouse name: Not on file   Number of children: 2   Years of education: Not on file   Highest education level: Associate degree: academic program  Occupational History   Occupation: Consulting civil engineer  Tobacco Use   Smoking status: Never   Smokeless tobacco: Never  Vaping Use   Vaping status: Never Used  Substance and Sexual Activity   Alcohol  use: Not Currently   Drug use: Never   Sexual activity: Yes    Partners: Male    Birth control/protection: None  Other Topics Concern   Not on file  Social History Narrative   Not on file   Social Drivers of Health   Financial Resource Strain: Low Risk  (10/24/2023)   Overall Financial Resource Strain (CARDIA)    Difficulty of Paying Living Expenses: Not very hard  Food Insecurity: No Food Insecurity (10/24/2023)   Hunger Vital Sign    Worried About Running Out of Food in the Last Year: Never true    Ran Out of Food in the Last Year: Never true   Transportation Needs: No Transportation Needs (10/24/2023)   PRAPARE - Administrator, Civil Service (Medical): No    Lack of Transportation (Non-Medical): No  Physical Activity: Sufficiently Active (10/24/2023)   Exercise Vital Sign    Days of Exercise per Week: 4 days    Minutes of Exercise per Session: 70 min  Stress: Stress Concern Present (10/24/2023)   [employer] of Occupational Health - Occupational Stress Questionnaire    Feeling of Stress: To some extent  Social Connections: Moderately Integrated (10/24/2023)   Social Connection and Isolation Panel    Frequency of Communication with Friends and Family: More than three times a week    Frequency of Social Gatherings with Friends and Family: More than three times a week    Attends Religious Services: 1 to 4 times per year    Active Member of [employer] or Organizations: No    Attends Engineer, structural: Not on file    Marital Status: Living with partner  Intimate Partner Violence: Not At Risk (10/25/2023)   Humiliation, Afraid, Rape, and Kick questionnaire    Fear of Current or Ex-Partner: No    Emotionally Abused: No    Physically Abused: No    Sexually Abused: No    Family History  Problem Relation Age of Onset   Breast cancer Neg Hx    Ovarian cancer Neg Hx     The following portions of the patient's history were reviewed and updated as appropriate: allergies, current medications, past OB history, past medical history, past surgical history, past family history, past social history, and problem list.  Constitutional: Denied constitutional symptoms, night sweats, recent illness, fever, insomnia and weight loss. Yes fatigue   Eyes: Denied eye symptoms, eye pain, photophobia, vision change and visual disturbance.  Ears/Nose/Throat/Neck: Denied ear, nose, throat or neck symptoms, hearing loss, nasal discharge, sinus congestion and sore throat.  Cardiovascular: Denied cardiovascular symptoms, arrhythmia,  chest pain/pressure, edema, exercise intolerance, orthopnea and palpitations.  Respiratory: Denied pulmonary symptoms, asthma, pleuritic pain, productive sputum, cough, dyspnea and wheezing.  Gastrointestinal: Denied gastro-esophageal reflux, melena, and vomiting yes nausea but manageable   Genitourinary: Denied genitourinary symptoms including symptomatic vaginal discharge, pelvic relaxation issues, and urinary complaints.  Musculoskeletal: Denied musculoskeletal symptoms, stiffness, swelling, muscle weakness and myalgia.  Dermatologic: Denied dermatology symptoms, rash and scar.  Neurologic: Denied neurology symptoms, dizziness, headache, neck pain and syncope.  Psychiatric: Denied psychiatric symptoms, anxiety and depression.  Endocrine: Denied endocrine symptoms including hot flashes and night sweats.     OBJECTIVE: Initial Physical Exam (New OB)  Physical Exam     ASSESSMENT: Normal pregnancy   PLAN: Routine prenatal care. We discussed an overview of prenatal care and when to call. Reviewed diet, exercise, and weight gain recommendations in pregnancy. Discussed benefits of breastfeeding and lactation resources  at [facility]. I reviewed labs and answered all questions.  1. Supervision of other normal pregnancy, antepartum (Primary) - NOB Panel - Culture, OB Urine - Monitor Drug Profile 14(MW) - Nicotine  screen, urine - Urinalysis, Routine w reflex microscopic - Hemoglobin A1c - Hgb Fractionation Cascade - Protein / creatinine ratio, urine - TSH + free T4 - US  MFM OB DETAIL +14 WK; Future - MaterniT21 PLUS Core - Comp Met (CMET) - Glucose tolerance, 1 hour; Future  2. [redacted] weeks gestation of pregnancy - NOB Panel - Culture, OB Urine - Monitor Drug Profile 14(MW) - Nicotine  screen, urine - Urinalysis, Routine w reflex microscopic - Hemoglobin A1c - Hgb Fractionation Cascade - Protein / creatinine ratio, urine - TSH + free T4 - US  MFM OB DETAIL +14 WK; Future - MaterniT21  PLUS Core - Comp Met (CMET)  3. Encounter for immunization - Flu vaccine trivalent PF, 6mos and older(Flulaval,Afluria,Fluarix,Fluzone)  4. Genetic screening - MaterniT21 PLUS Core  5. Encounter for drug screening - Monitor Drug Profile 14(MW)  6. Cervical cancer screening - Cytology - PAP  7. Screening examination for STI - Cytology - PAP  8. Screening for diabetes mellitus - Glucose tolerance, 1 hour; Future   -Discussed POC for BMI>50, will start Baby ASA early one  hour at next visit. Will need anesthesia consult, plan third trimester testing  -Understands medication to treat anxiety are safe in pregnancy, encouraged  to  discuss with us  if she notices her anxiety is increasing.    JINNIE HERO Taima Rada, CNM

## 2023-12-14 LAB — URINALYSIS, ROUTINE W REFLEX MICROSCOPIC
Bilirubin, UA: NEGATIVE
Glucose, UA: NEGATIVE
Nitrite, UA: NEGATIVE
Protein,UA: NEGATIVE
RBC, UA: NEGATIVE
Specific Gravity, UA: 1.015 (ref 1.005–1.030)
Urobilinogen, Ur: 0.2 mg/dL (ref 0.2–1.0)
pH, UA: 6.5 (ref 5.0–7.5)

## 2023-12-14 LAB — PROTEIN / CREATININE RATIO, URINE
Creatinine, Urine: 75.6 mg/dL
Protein, Ur: 9 mg/dL
Protein/Creat Ratio: 119 mg/g{creat} (ref 0–200)

## 2023-12-14 LAB — MICROSCOPIC EXAMINATION
Casts: NONE SEEN /LPF
RBC, Urine: NONE SEEN /HPF (ref 0–2)

## 2023-12-15 LAB — CBC/D/PLT+RPR+RH+ABO+RUBIGG...
Antibody Screen: NEGATIVE
Basophils Absolute: 0 x10E3/uL (ref 0.0–0.2)
Basos: 0 %
EOS (ABSOLUTE): 0.1 x10E3/uL (ref 0.0–0.4)
Eos: 1 %
HCV Ab: NONREACTIVE
HIV Screen 4th Generation wRfx: NONREACTIVE
Hematocrit: 37.5 % (ref 34.0–46.6)
Hemoglobin: 11.5 g/dL (ref 11.1–15.9)
Hepatitis B Surface Ag: NEGATIVE
Immature Grans (Abs): 0.1 x10E3/uL (ref 0.0–0.1)
Immature Granulocytes: 1 %
Lymphocytes Absolute: 1.4 x10E3/uL (ref 0.7–3.1)
Lymphs: 14 %
MCH: 23.3 pg — ABNORMAL LOW (ref 26.6–33.0)
MCHC: 30.7 g/dL — ABNORMAL LOW (ref 31.5–35.7)
MCV: 76 fL — ABNORMAL LOW (ref 79–97)
Monocytes Absolute: 0.4 x10E3/uL (ref 0.1–0.9)
Monocytes: 5 %
Neutrophils Absolute: 7.8 x10E3/uL — ABNORMAL HIGH (ref 1.4–7.0)
Neutrophils: 79 %
Platelets: 259 x10E3/uL (ref 150–450)
RBC: 4.93 x10E6/uL (ref 3.77–5.28)
RDW: 16.1 % — ABNORMAL HIGH (ref 11.7–15.4)
RPR Ser Ql: NONREACTIVE
Rh Factor: POSITIVE
Rubella Antibodies, IGG: 1.24 {index} (ref >0.99)
Varicella zoster IgG: REACTIVE
WBC: 9.7 x10E3/uL (ref 3.4–10.8)

## 2023-12-15 LAB — COMPREHENSIVE METABOLIC PANEL WITH GFR
ALT: 11 IU/L (ref 0–32)
AST: 9 IU/L (ref 0–40)
Albumin: 4.1 g/dL (ref 4.0–5.0)
Alkaline Phosphatase: 73 IU/L (ref 44–121)
BUN/Creatinine Ratio: 18 (ref 9–23)
BUN: 8 mg/dL (ref 6–20)
Bilirubin Total: 0.3 mg/dL (ref 0.0–1.2)
CO2: 21 mmol/L (ref 20–29)
Calcium: 9.3 mg/dL (ref 8.7–10.2)
Chloride: 99 mmol/L (ref 96–106)
Creatinine, Ser: 0.45 mg/dL — ABNORMAL LOW (ref 0.57–1.00)
Globulin, Total: 2.9 g/dL (ref 1.5–4.5)
Glucose: 77 mg/dL (ref 70–99)
Potassium: 3.9 mmol/L (ref 3.5–5.2)
Sodium: 133 mmol/L — ABNORMAL LOW (ref 134–144)
Total Protein: 7 g/dL (ref 6.0–8.5)
eGFR: 133 mL/min/1.73 (ref >59)

## 2023-12-15 LAB — HCV INTERPRETATION

## 2023-12-15 LAB — URINE CULTURE, OB REFLEX

## 2023-12-15 LAB — HEMOGLOBIN A1C
Est. average glucose Bld gHb Est-mCnc: 114 mg/dL
Hgb A1c MFr Bld: 5.6 % (ref 4.8–5.6)

## 2023-12-15 LAB — HGB FRACTIONATION CASCADE
Hgb A2: 2.3 % (ref 1.8–3.2)
Hgb A: 97.7 % (ref 96.4–98.8)
Hgb F: 0 % (ref 0.0–2.0)
Hgb S: 0 %

## 2023-12-15 LAB — TSH+FREE T4
Free T4: 1.18 ng/dL (ref 0.82–1.77)
TSH: 1.37 u[IU]/mL (ref 0.450–4.500)

## 2023-12-15 LAB — CULTURE, OB URINE

## 2023-12-16 LAB — MONITOR DRUG PROFILE 14(MW)
Amphetamine Scrn, Ur: NEGATIVE ng/mL
BARBITURATE SCREEN URINE: NEGATIVE ng/mL
BENZODIAZEPINE SCREEN, URINE: NEGATIVE ng/mL
Buprenorphine, Urine: NEGATIVE ng/mL
CANNABINOIDS UR QL SCN: NEGATIVE ng/mL
Cocaine (Metab) Scrn, Ur: NEGATIVE ng/mL
Creatinine(Crt), U: 85.2 mg/dL (ref 20.0–300.0)
Fentanyl, Urine: NEGATIVE pg/mL
Meperidine Screen, Urine: NEGATIVE ng/mL
Methadone Screen, Urine: NEGATIVE ng/mL
OXYCODONE+OXYMORPHONE UR QL SCN: NEGATIVE ng/mL
Opiate Scrn, Ur: NEGATIVE ng/mL
Ph of Urine: 6 (ref 4.5–8.9)
Phencyclidine Qn, Ur: NEGATIVE ng/mL
Propoxyphene Scrn, Ur: NEGATIVE ng/mL
SPECIFIC GRAVITY: 1.011
Tramadol Screen, Urine: NEGATIVE ng/mL

## 2023-12-16 LAB — NICOTINE SCREEN, URINE: Cotinine Ql Scrn, Ur: NEGATIVE ng/mL

## 2023-12-17 LAB — CYTOLOGY - PAP
Chlamydia: POSITIVE — AB
Comment: NEGATIVE
Comment: NORMAL
Diagnosis: NEGATIVE
Neisseria Gonorrhea: NEGATIVE

## 2023-12-18 ENCOUNTER — Other Ambulatory Visit: Payer: Self-pay | Admitting: Licensed Practical Nurse

## 2023-12-18 DIAGNOSIS — A749 Chlamydial infection, unspecified: Secondary | ICD-10-CM

## 2023-12-18 MED ORDER — AZITHROMYCIN 500 MG PO TABS: 1000.0000 mg | ORAL_TABLET | Freq: Once | ORAL | 1 refills | Status: AC

## 2023-12-18 NOTE — Progress Notes (Signed)
 Tc to pt, reviewed positive for Chlamydia, she is aware that it is a STD, her partner does not have a way to be treated. Script for Azithromycin  sent with 1 refill. Instructed to abstain from sex until they are both treated and 7 days have passed.  Jinnie Cookey, CNM  [facility] 12/18/23  12:30 PM

## 2023-12-19 LAB — MATERNIT 21 PLUS CORE, BLOOD
Fetal Fraction: 6
Result (T21): NEGATIVE
Trisomy 13 (Patau syndrome): NEGATIVE
Trisomy 18 (Edwards syndrome): NEGATIVE
Trisomy 21 (Down syndrome): NEGATIVE

## 2024-01-02 ENCOUNTER — Telehealth: Payer: Self-pay

## 2024-01-03 NOTE — Telephone Encounter (Signed)
 Contacted the patient via phone, she is confirmed for 1gtt for 10/10 at her routine office visit

## 2024-01-09 NOTE — Patient Instructions (Signed)
 Second Trimester of Pregnancy  The second trimester of pregnancy is from week 14 through week 27. This is months 4 through 6 of pregnancy. During the second trimester: Morning sickness is less or has stopped. You may have more energy. You may feel hungry more often. At this time, your unborn baby is growing very fast. At the end of the sixth month, the unborn baby may be up to 12 inches long and weigh about 1 pounds. You will likely start to feel the baby move between 16 and 20 weeks of pregnancy. Body changes during your second trimester Your body continues to change during this time. The changes usually go away after your baby is born. Physical changes You will gain more weight. Your belly will get bigger. You may begin to get stretch marks on your hips, belly, and breasts. Your breasts will keep growing and may hurt. You may get dark spots or blotches on your face. A dark line from your belly button to the pubic area may appear. This line is called linea nigra. Your hair may grow faster and get thicker. Health changes You may have headaches. You may have heartburn. You may pee more often. You may have swollen, bulging veins (varicose veins). You may have trouble pooping (constipation), or swollen veins in the butt that can itch or get painful (hemorrhoids). You may have back pain. This is caused by: Weight gain. Pregnancy hormones that are relaxing the joints in your pelvis. Follow these instructions at home: Medicines Talk to your health care provider if you're taking medicines. Ask if the medicines are safe to take during pregnancy. Your provider may change the medicines that you take. Do not take any medicines unless told to by your provider. Take a prenatal vitamin that has at least 600 micrograms (mcg) of folic acid . Do not use herbal medicines, illegal drugs, or medicines that are not approved by your provider. Eating and drinking While you're pregnant your body needs  extra food for your growing baby. Talk with your provider about what to eat while pregnant. Activity Most women are able to exercise during pregnancy. Exercises may need to change as your pregnancy goes on. Talk to your provider about your activities and exercise routines. Relieving pain and discomfort Wear a good, supportive bra if your breasts hurt. Rest with your legs raised if you have leg cramps or low back pain. Take warm sitz baths to soothe pain from hemorrhoids. Use hemorrhoid cream if your provider says it's okay. Do not douche. Do not use tampons or scented pads. Do not use hot tubs, steam rooms, or saunas. Safety Wear your seatbelt at all times when you're in a car. Talk to your provider if someone hits you, hurts you, or yells at you. Talk with your provider if you're feeling sad or have thoughts of hurting yourself. Lifestyle Certain things can be harmful while you're pregnant. It's best to avoid the following: Do not drink alcohol,smoke, vape, or use products with nicotine  or tobacco in them. If you need help quitting, talk with your provider. Avoid cat litter boxes and soil used by cats. These things carry germs that can cause harm to your pregnancy and your baby. General instructions Keep all follow-up visits. It helps you and your unborn baby stay as healthy as possible. Write down your questions. Take them to your prenatal visits. Your provider will: Talk with you about your overall health. Give you advice or refer you to specialists who can help with different needs,  including: Prenatal education classes. Mental health and counseling. Foods and healthy eating. Ask for help if you need help with food. Where to find more information American Pregnancy Association: americanpregnancy.org [employer] of Obstetricians and Gynecologists: acog.org Office on [employer] Health: TravelLesson.ca Contact a health care provider if: You have a headache that does not go away  when you take medicine. You have any of these problems: You can't eat or drink. You throw up or feel like you may throw up. You have watery poop (diarrhea) for 2 days or more. You have pain when you pee or your pee smells bad. You have been sick for 2 days or more and are not getting better. Contact your provider right away if: You have any of these coming from your vagina: Abnormal discharge. Bad-smelling fluid. Bleeding. Your baby is moving less than usual. You have contractions, belly cramping, or have pain in your pelvis or lower back. You have symptoms of high blood pressure or preeclampsia. These include: A severe, throbbing headache that does not go away. Sudden or extreme swelling of your face, hands, legs, or feet. Vision problems: You see spots. You have blurry vision. Your eyes are sensitive to light. If you can't reach the provider, go to an urgent care or emergency room. Get help right away if: You faint, become confused, or can't think clearly. You have chest pain or trouble breathing. You have any kind of injury, such as from a fall or a car crash. These symptoms may be an emergency. Call 911 right away. Do not wait to see if the symptoms will go away. Do not drive yourself to the hospital. This information is not intended to replace advice given to you by your health care provider. Make sure you discuss any questions you have with your health care provider. Document Revised: 12/20/2022 Document Reviewed: 07/20/2022 Elsevier Patient Education  2024 Elsevier Inc. Round Ligament Pain  The round ligaments are a pair of cord-like tissues that help support the uterus. They can become a source of pain during pregnancy as the ligaments soften and stretch as the baby grows. The pain usually begins in the second trimester (13-28 weeks) of pregnancy, and should only last for a few seconds when it occurs. However, the pain can come and go until the baby is delivered. The pain  does not cause harm to the baby. Round ligament pain is usually a short, sharp, and pinching pain, but it can also be a dull, lingering, and aching pain. The pain is felt in the lower side of the abdomen or in the groin. It usually starts deep in the groin and moves up to the outside of the hip area. The pain may happen when you: Suddenly change position, such as quickly going from a sitting to standing position. Do physical activity. Cough or sneeze. Follow these instructions at home: Managing pain  When the pain starts, relax. Then, try any of these methods to help with the pain: Sit down. Flex your knees up to your abdomen. Lie on your side with one pillow under your abdomen and another pillow between your legs. Sit in a warm bath for 15-20 minutes or until the pain goes away. General instructions Watch your condition for any changes. Move slowly when you sit down or stand up. Stop or reduce your physical activities if they cause pain. Avoid long walks if they cause pain. Take over-the-counter and prescription medicines only as told by your health care provider. Keep all follow-up visits. This  is important. Contact a health care provider if: Your pain does not go away with treatment. You feel pain in your back that you did not have before. Your medicine is not helping. You have a fever or chills. You have nausea or vomiting. You have diarrhea. You have pain when you urinate. Get help right away if: You have pain that is a rhythmic, cramping pain similar to labor pains. Labor pains are usually 2 minutes apart, last for about 1 minute, and involve a bearing down feeling or pressure in your pelvis. You have vaginal bleeding. These symptoms may represent a serious problem that is an emergency. Do not wait to see if the symptoms will go away. Get medical help right away. Call your local emergency services (911 in the [place] Do not drive yourself to the hospital. Summary Round ligament  pain is felt in the lower abdomen or groin. This pain usually begins in the second trimester (13-28 weeks) and should only last for a few seconds when it occurs. You may notice the pain when you suddenly change position, when you cough or sneeze, or during physical activity. Relaxing, flexing your knees to your abdomen, lying on one side, or taking a warm bath may help to get rid of the pain. Contact your health care provider if the pain does not go away. This information is not intended to replace advice given to you by your health care provider. Make sure you discuss any questions you have with your health care provider. Document Revised: 06/01/2020 Document Reviewed: 06/01/2020 Elsevier Patient Education  2024 [employer].

## 2024-01-10 ENCOUNTER — Ambulatory Visit (INDEPENDENT_AMBULATORY_CARE_PROVIDER_SITE_OTHER): Admitting: Advanced Practice Midwife

## 2024-01-10 ENCOUNTER — Other Ambulatory Visit

## 2024-01-10 VITALS — BP 107/65 | HR 94 | Wt 277.0 lb

## 2024-01-10 DIAGNOSIS — Z131 Encounter for screening for diabetes mellitus: Secondary | ICD-10-CM | POA: Diagnosis not present

## 2024-01-10 DIAGNOSIS — Z3480 Encounter for supervision of other normal pregnancy, unspecified trimester: Secondary | ICD-10-CM | POA: Diagnosis not present

## 2024-01-10 DIAGNOSIS — O0992 Supervision of high risk pregnancy, unspecified, second trimester: Secondary | ICD-10-CM

## 2024-01-10 DIAGNOSIS — Z3A17 17 weeks gestation of pregnancy: Secondary | ICD-10-CM

## 2024-01-10 DIAGNOSIS — O99212 Obesity complicating pregnancy, second trimester: Secondary | ICD-10-CM

## 2024-01-10 DIAGNOSIS — O99210 Obesity complicating pregnancy, unspecified trimester: Secondary | ICD-10-CM | POA: Insufficient documentation

## 2024-01-10 NOTE — Progress Notes (Signed)
 Routine Prenatal Care Visit  Subjective  Ashley Austin is a 28 y.o. G3P2002 at [redacted]w[redacted]d being seen today for ongoing prenatal care.  She is currently monitored for the following issues for this high-risk pregnancy and has Current episode of major depressive disorder without prior episode; Supervision of high-risk pregnancy; History of postpartum hypertension; and Obesity affecting pregnancy on their problem list.  ----------------------------------------------------------------------------------- Patient reports she is doing well. Has no concerns.   Contractions: Not present. Vag. Bleeding: None.  Movement: Present. Leaking Fluid denies.  ----------------------------------------------------------------------------------- The following portions of the patient's history were reviewed and updated as appropriate: allergies, current medications, past family history, past medical history, past social history, past surgical history and problem list. Problem list updated.  Objective  BP 107/65   Pulse 94   Wt 277 lb (125.6 kg)   LMP 09/07/2023 (Exact Date)   BMI 52.34 kg/m  Pregravid weight 275 lb (124.7 kg) Total Weight Gain 2 lb (0.907 kg) Urinalysis: Urine Protein    Urine Glucose    Fetal Status: Fetal Heart Rate (bpm): 143   Movement: Present      General:  Alert, oriented and cooperative. Patient is in no acute distress.  Skin: Skin is warm and dry. No rash noted.   Cardiovascular: Normal heart rate noted  Respiratory: Normal respiratory effort, no problems with respiration noted  Abdomen: Soft, gravid, appropriate for gestational age. Pain/Pressure: Absent     Pelvic:  Cervical exam deferred        Extremities: Normal range of motion.  Edema: None  Mental Status: Normal mood and affect. Normal behavior. Normal judgment and thought content.   Assessment   28 y.o. G3P2002 at [redacted]w[redacted]d by  06/13/2024, by Last Menstrual Period presenting for routine prenatal visit  Plan   THIRD  Problems (from 10/25/23 to present)     Problem Noted Diagnosed Resolved   Supervision of other normal pregnancy, antepartum 10/25/2023 by Loralyn Sander, CMA  No   Overview Addendum 01/09/2024  1:50 PM by Taft Salines, LPN   Clinical Staff Provider  Office Location  [facility] Dating  06/13/2024, by Last Menstrual Period  Language  English Anatomy US   Scheduled 10/27 w/MFM  Flu Vaccine  12/13/23 Genetic Screen  NIPS: neg:xx  TDaP vaccine  Offer Hgb A1C or  GTT Early : 5.6 Third trimester :   Covid 2-Pfizer   LAB RESULTS   Rhogam  O/Positive/-- (09/12 1400)  Blood Type O/Positive/-- (09/12 1400)   RSV Offer Antibody Negative (09/12 1400)  Feeding Plan Formula Rubella 1.24 (09/12 1400)  Contraception IUD RPR Non Reactive (09/12 1400)   Circumcision No HBsAg Negative (09/12 1400)   Pediatrician  [facility] HIV Non Reactive (09/12 1400)  Support Person FOB Varicella Reactive (09/12 1400)  Prenatal Classes No GBS  (For PCN allergy, check sensitivities)     Hep C Non Reactive (09/12 1400)   BTL Consent N/A Pap Diagnosis  Date Value Ref Range Status  12/13/2023   Final   - Negative for intraepithelial lesion or malignancy (NILM)    VBAC Consent N/A Hgb Electro      CF      SMA                    Preterm labor symptoms and general obstetric precautions including but not limited to vaginal bleeding, contractions, leaking of fluid and fetal movement were reviewed in detail with the patient. Please refer to After Visit Summary for other counseling  recommendations.   Return in about 4 weeks (around 02/07/2024) for rob.   Slater Rains, CNM [facility] 01/10/2024 9:43 AM

## 2024-01-11 LAB — GLUCOSE TOLERANCE, 1 HOUR: Glucose, 1Hr PP: 86 mg/dL (ref 70–199)

## 2024-01-21 ENCOUNTER — Other Ambulatory Visit: Payer: Self-pay

## 2024-01-21 ENCOUNTER — Encounter: Payer: Self-pay | Admitting: Emergency Medicine

## 2024-01-21 ENCOUNTER — Emergency Department
Admission: EM | Admit: 2024-01-21 | Discharge: 2024-01-21 | Disposition: A | Discharge status: Home / Self Care | Attending: Emergency Medicine | Admitting: Emergency Medicine

## 2024-01-21 ENCOUNTER — Encounter: Payer: Self-pay | Admitting: Advanced Practice Midwife

## 2024-01-21 ENCOUNTER — Emergency Department

## 2024-01-21 DIAGNOSIS — Y9301 Activity, walking, marching and hiking: Secondary | ICD-10-CM | POA: Insufficient documentation

## 2024-01-21 DIAGNOSIS — Z3A00 Weeks of gestation of pregnancy not specified: Secondary | ICD-10-CM | POA: Diagnosis not present

## 2024-01-21 DIAGNOSIS — O9A212 Injury, poisoning and certain other consequences of external causes complicating pregnancy, second trimester: Secondary | ICD-10-CM | POA: Diagnosis not present

## 2024-01-21 DIAGNOSIS — R109 Unspecified abdominal pain: Secondary | ICD-10-CM | POA: Diagnosis not present

## 2024-01-21 DIAGNOSIS — Y92009 Unspecified place in unspecified non-institutional (private) residence as the place of occurrence of the external cause: Secondary | ICD-10-CM | POA: Insufficient documentation

## 2024-01-21 DIAGNOSIS — O26899 Other specified pregnancy related conditions, unspecified trimester: Secondary | ICD-10-CM

## 2024-01-21 DIAGNOSIS — W19XXXA Unspecified fall, initial encounter: Secondary | ICD-10-CM | POA: Insufficient documentation

## 2024-01-21 DIAGNOSIS — Z3A18 18 weeks gestation of pregnancy: Secondary | ICD-10-CM | POA: Diagnosis not present

## 2024-01-21 DIAGNOSIS — O26892 Other specified pregnancy related conditions, second trimester: Secondary | ICD-10-CM | POA: Diagnosis not present

## 2024-01-21 DIAGNOSIS — O9A219 Injury, poisoning and certain other consequences of external causes complicating pregnancy, unspecified trimester: Secondary | ICD-10-CM | POA: Diagnosis not present

## 2024-01-21 DIAGNOSIS — Z3A17 17 weeks gestation of pregnancy: Secondary | ICD-10-CM | POA: Diagnosis not present

## 2024-01-21 DIAGNOSIS — Z3491 Encounter for supervision of normal pregnancy, unspecified, first trimester: Secondary | ICD-10-CM

## 2024-01-21 LAB — URINALYSIS, ROUTINE W REFLEX MICROSCOPIC
Bacteria, UA: NONE SEEN
Bilirubin Urine: NEGATIVE
Glucose, UA: NEGATIVE mg/dL
Hgb urine dipstick: NEGATIVE
Ketones, ur: NEGATIVE mg/dL
Nitrite: NEGATIVE
Protein, ur: NEGATIVE mg/dL
Specific Gravity, Urine: 1.016 (ref 1.005–1.030)
pH: 7 (ref 5.0–8.0)

## 2024-01-21 NOTE — Discharge Instructions (Signed)
 Follow-up with your OB/GYN if any continued problems.  Return to the emergency department if you develop any worsening of your symptoms, vaginal bleeding or increased abdominal pain.  You may take Tylenol  sparingly as needed for pain.  Do not take anti-inflammatories.

## 2024-01-21 NOTE — Telephone Encounter (Signed)
 Spoke with Pt who stated she was having some pain and fell kind of hard I let her know to go to ed for monitoring as she stated she was uncomfortable.

## 2024-01-21 NOTE — ED Provider Notes (Signed)
 [facility] Provider Note    Event Date/Time   First MD Initiated Contact with Patient 01/21/24 [phone]     (approximate)   History   Fall   HPI  Ashley Austin is a 28 y.o. female presents to the ED this morning after mechanical fall that occurred while taking her children to school.  She states she was walking on the steps outside her apartment building when her foot got caught in a book bag strap causing her to fall forward.  She denies any head injury or loss of consciousness.  She has had some abdominal cramping but no vaginal bleeding.  Patient is currently [redacted] weeks pregnant and has been seeing [facility].  Patient has a history of gestational and postpartum hypertension.     Physical Exam   Triage Vital Signs: ED Triage Vitals [01/21/24 0804]  Encounter Vitals Group     BP (!) 111/49     Girls Systolic BP Percentile      Girls Diastolic BP Percentile      Boys Systolic BP Percentile      Boys Diastolic BP Percentile      Pulse Rate 93     Resp 17     Temp 98.5 F (36.9 C)     Temp Source Oral     SpO2 100 %     Weight 277 lb (125.6 kg)     Height 5' 1 (1.549 m)     Head Circumference      Peak Flow      Pain Score 2     Pain Loc      Pain Education      Exclude from Growth Chart     Most recent vital signs: Vitals:   01/21/24 0804  BP: (!) 111/49  Pulse: 93  Resp: 17  Temp: 98.5 F (36.9 C)  SpO2: 100%     General: Awake, no distress.  CV:  Good peripheral perfusion.  Heart regular rate and rhythm. Resp:  Normal effort.  Lungs clear bilaterally. Abd:  No distention.  Soft, nontender, bowel sounds normoactive x 4 quadrants.  No abrasions or discoloration noted. Other:  Patient is able to move upper and lower extremities without any difficulty.  Patient is ambulatory without any assistance.  No abrasions noted.  No pain with weightbearing.   ED Results / Procedures / Treatments   Labs (all labs ordered are listed,  but only abnormal results are displayed) Labs Reviewed  URINALYSIS, ROUTINE W REFLEX MICROSCOPIC - Abnormal; Notable for the following components:      Result Value   Color, Urine YELLOW (*)    APPearance CLEAR (*)    Leukocytes,Ua TRACE (*)    All other components within normal limits      RADIOLOGY Ultrasound OB per radiology shows single IUP at 17 weeks and 3 days with a heart rate of 147.    PROCEDURES:  Critical Care performed:   Procedures   MEDICATIONS ORDERED IN ED: Medications - No data to display   IMPRESSION / MDM / ASSESSMENT AND PLAN / ED COURSE  I reviewed the triage vital signs and the nursing notes.   Differential diagnosis includes, but is not limited to, abdominal contusion, trauma to abdomen with first trimester pregnancy, placental abruption, subchorionic hemorrhage, fetal trauma.  28 year old female presents to the ED after a fall that occurred at home while she was taking her children to school.  Patient states this was a mechanical fall  with her tripping over the strap of a book bag.  Patient was reassured with ultrasound being negative for fetal trauma with a single IUP at 17 weeks and 3 days.  Heart rate was 147.  Patient is strongly encouraged to follow-up with her OB/GYN if any concerns or return to the emergency department if any worsening of her symptoms.  She was made aware that her urinalysis was negative for hematuria.  At the time of discharge patient was reassured and denied any abdominal pain.      Patient's presentation is most consistent with acute complicated illness / injury requiring diagnostic workup.  FINAL CLINICAL IMPRESSION(S) / ED DIAGNOSES   Final diagnoses:  First trimester pregnancy  Fall in home, initial encounter     Rx / DC Orders   ED Discharge Orders     None        Note:  This document was prepared using Dragon voice recognition software and may include unintentional dictation errors.   Saunders Shona CROME, PA-C 01/21/24 1314    Willo Dunnings, MD 01/21/24 [phone]

## 2024-01-21 NOTE — ED Notes (Signed)
 See triage note  Presents s/p fall  States she tripped and fell down about 6 steps   Denies any vaginal bleeding but is having some abd cramping Pt is approx [redacted] weeks pregnant

## 2024-01-21 NOTE — ED Triage Notes (Signed)
 Patient to ED via POV fro a fall. Currently [redacted] weeks pregnant. PT states she had a mechanical fall down approx 6 stairs. Denies hitting head or LOC. C/o abd cramping since fall, denies bleeding.

## 2024-01-21 NOTE — Telephone Encounter (Signed)
 Pt agreed to go to ED for monitoring.

## 2024-01-27 ENCOUNTER — Ambulatory Visit: Attending: Maternal & Fetal Medicine

## 2024-01-27 ENCOUNTER — Ambulatory Visit

## 2024-01-27 ENCOUNTER — Other Ambulatory Visit: Payer: Self-pay | Admitting: Licensed Practical Nurse

## 2024-01-27 DIAGNOSIS — Z3A18 18 weeks gestation of pregnancy: Secondary | ICD-10-CM | POA: Insufficient documentation

## 2024-01-27 DIAGNOSIS — Z23 Encounter for immunization: Secondary | ICD-10-CM

## 2024-01-27 DIAGNOSIS — Z363 Encounter for antenatal screening for malformations: Secondary | ICD-10-CM | POA: Insufficient documentation

## 2024-01-27 DIAGNOSIS — Z3480 Encounter for supervision of other normal pregnancy, unspecified trimester: Secondary | ICD-10-CM

## 2024-01-27 DIAGNOSIS — Z1379 Encounter for other screening for genetic and chromosomal anomalies: Secondary | ICD-10-CM

## 2024-01-27 DIAGNOSIS — O99212 Obesity complicating pregnancy, second trimester: Secondary | ICD-10-CM | POA: Insufficient documentation

## 2024-01-27 DIAGNOSIS — Z0283 Encounter for blood-alcohol and blood-drug test: Secondary | ICD-10-CM

## 2024-01-27 DIAGNOSIS — Z113 Encounter for screening for infections with a predominantly sexual mode of transmission: Secondary | ICD-10-CM

## 2024-01-27 DIAGNOSIS — Z131 Encounter for screening for diabetes mellitus: Secondary | ICD-10-CM

## 2024-01-27 DIAGNOSIS — Z3A13 13 weeks gestation of pregnancy: Secondary | ICD-10-CM

## 2024-01-27 DIAGNOSIS — E669 Obesity, unspecified: Secondary | ICD-10-CM | POA: Diagnosis not present

## 2024-01-27 DIAGNOSIS — Z124 Encounter for screening for malignant neoplasm of cervix: Secondary | ICD-10-CM

## 2024-01-27 DIAGNOSIS — O0992 Supervision of high risk pregnancy, unspecified, second trimester: Secondary | ICD-10-CM

## 2024-01-27 DIAGNOSIS — O09292 Supervision of pregnancy with other poor reproductive or obstetric history, second trimester: Secondary | ICD-10-CM | POA: Diagnosis not present

## 2024-01-28 ENCOUNTER — Ambulatory Visit: Payer: Self-pay | Admitting: Licensed Practical Nurse

## 2024-01-28 ENCOUNTER — Other Ambulatory Visit: Payer: Self-pay | Admitting: Licensed Practical Nurse

## 2024-01-28 DIAGNOSIS — Z369 Encounter for antenatal screening, unspecified: Secondary | ICD-10-CM

## 2024-01-28 DIAGNOSIS — Z6841 Body Mass Index (BMI) 40.0 and over, adult: Secondary | ICD-10-CM

## 2024-01-28 DIAGNOSIS — Z3480 Encounter for supervision of other normal pregnancy, unspecified trimester: Secondary | ICD-10-CM

## 2024-02-03 ENCOUNTER — Other Ambulatory Visit: Payer: Self-pay

## 2024-02-03 DIAGNOSIS — O99212 Obesity complicating pregnancy, second trimester: Secondary | ICD-10-CM

## 2024-02-03 DIAGNOSIS — Z362 Encounter for other antenatal screening follow-up: Secondary | ICD-10-CM

## 2024-02-07 ENCOUNTER — Encounter: Admitting: Certified Nurse Midwife

## 2024-02-11 ENCOUNTER — Encounter: Payer: Self-pay | Admitting: Certified Nurse Midwife

## 2024-02-11 ENCOUNTER — Other Ambulatory Visit: Payer: Self-pay | Admitting: Certified Nurse Midwife

## 2024-02-11 DIAGNOSIS — O0990 Supervision of high risk pregnancy, unspecified, unspecified trimester: Secondary | ICD-10-CM

## 2024-02-11 DIAGNOSIS — O99210 Obesity complicating pregnancy, unspecified trimester: Secondary | ICD-10-CM

## 2024-02-17 ENCOUNTER — Institutional Professional Consult (permissible substitution)

## 2024-02-19 NOTE — Progress Notes (Signed)
    Return Prenatal Note   Subjective   28 y.o. G3P2002 at [redacted]w[redacted]d presents for this follow-up prenatal visit.  Patient feeling well, self swab collected for TOC. Patient reports: Movement: Present Contractions: Not present  Objective   Flow sheet Vitals: Pulse Rate: 79 BP: 124/80 Fetal Heart Rate (bpm): 150 Total weight gain: 7 lb (3.175 kg)  General Appearance  No acute distress, well appearing, and well nourished Pulmonary   Normal work of breathing Neurologic   Alert and oriented to person, place, and time Psychiatric   Mood and affect within normal limits   Assessment/Plan   Plan  28 y.o. G3P2002 at [redacted]w[redacted]d presents for follow-up OB visit. Reviewed prenatal record including previous visit note.  Supervision of high-risk pregnancy Red flag symptoms, how to contact provider reviewed. Anticipatory guidance for 28w labs reviewed. Keep scheduled MFM anatomy appointment.  Obesity affecting pregnancy MFM scheduled-had early anatomy with follow up planned 4-5w after, 12/19. Anesthesia consult ordered. Encouraged to continue to pay attention to diet.      Orders Placed This Encounter  Procedures   28 Week RH+Panel    Standing Status:   Future    Expected Date:   03/22/2024    Expiration Date:   02/20/2025   Return in 4 weeks (on 03/20/2024) for ROB & GDM screening.   Future Appointments  Date Time Provider Department Center  03/20/2024  9:00 AM [facility] PROVIDER 1 [facility]  03/20/2024  9:30 AM [facility] US4 [facility]  03/27/2024  8:40 AM [facility] LAB [facility] None  03/27/2024  9:35 AM Jayne Harlene CROME, CNM [facility] None    For next visit:  ROB with 1 hour glucola, third trimester labs, and Tdap     Harlene CROME Jayne, CNM  11/21/258:38 AM

## 2024-02-21 ENCOUNTER — Other Ambulatory Visit (HOSPITAL_COMMUNITY)
Admission: RE | Admit: 2024-02-21 | Discharge: 2024-02-21 | Disposition: A | Source: Ambulatory Visit | Attending: Certified Nurse Midwife | Admitting: Certified Nurse Midwife

## 2024-02-21 ENCOUNTER — Ambulatory Visit (INDEPENDENT_AMBULATORY_CARE_PROVIDER_SITE_OTHER): Admitting: Certified Nurse Midwife

## 2024-02-21 VITALS — BP 124/80 | HR 79 | Wt 282.0 lb

## 2024-02-21 DIAGNOSIS — Z113 Encounter for screening for infections with a predominantly sexual mode of transmission: Secondary | ICD-10-CM

## 2024-02-21 DIAGNOSIS — Z131 Encounter for screening for diabetes mellitus: Secondary | ICD-10-CM | POA: Diagnosis not present

## 2024-02-21 DIAGNOSIS — O0992 Supervision of high risk pregnancy, unspecified, second trimester: Secondary | ICD-10-CM

## 2024-02-21 DIAGNOSIS — O99210 Obesity complicating pregnancy, unspecified trimester: Secondary | ICD-10-CM

## 2024-02-21 DIAGNOSIS — Z3A22 22 weeks gestation of pregnancy: Secondary | ICD-10-CM

## 2024-02-21 DIAGNOSIS — Z114 Encounter for screening for human immunodeficiency virus [HIV]: Secondary | ICD-10-CM

## 2024-02-21 DIAGNOSIS — Z369 Encounter for antenatal screening, unspecified: Secondary | ICD-10-CM

## 2024-02-21 NOTE — Assessment & Plan Note (Signed)
 MFM scheduled-had early anatomy with follow up planned 4-5w after, 12/19. Anesthesia consult ordered. Encouraged to continue to pay attention to diet.

## 2024-02-21 NOTE — Assessment & Plan Note (Signed)
 Red flag symptoms, how to contact provider reviewed. Anticipatory guidance for 28w labs reviewed. Keep scheduled MFM anatomy appointment.

## 2024-02-21 NOTE — Patient Instructions (Signed)
 Gestational Diabetes Mellitus, Diagnosis Gestational diabetes mellitus, or gestational diabetes, is diabetes that some people get when pregnant. This condition usually happens at 24-28 weeks of pregnancy.  People with gestational diabetes have high blood sugar. If you do not get treated for this condition, it may cause problems for you and your baby. It goes away after you give birth but can happen again the next time you become pregnant. What are the causes? This condition is caused by changes in your body when you're pregnant. When these changes happen: The pancreas may not make enough insulin. The body may not use insulin in the right way. This is called insulin resistance. Insulin helps your body use sugar for energy. If your body doesn't have enough insulin or can't use the insulin that it has, extra sugars stay in your blood. This leads to high blood sugar (hyperglycemia). What increases the risk? Being older than age 51 when pregnant. Too much body weight. Having polycystic ovary syndrome (PCOS). Having someone with diabetes in your family. Having had this condition in the past. Being pregnant with more than one baby. What are the signs or symptoms? You may not have any symptoms. If you do have symptoms, they may include: Being thirsty often. Being hungry often. Needing to pee more often. These symptoms can be missed because they're similar to other symptoms of pregnancy. How is this diagnosed? This condition may be diagnosed based on your blood sugar level and how your body responds to glucose. This may be checked with an oral glucose tolerance test (OGTT). The test may be done: Early in pregnancy if you have risk factors. At 24-28 weeks of your pregnancy. How is this treated? To treat this condition, you may be told to: Eat a healthy diet. Get more exercise. Check your blood sugar often. Your health care provider will tell you what your target is. Take insulin and other  medicines. These are taken if needed. Work with a diabetes expert. Follow these instructions at home: Learn about your diabetes Ask your provider: How often should I check my blood sugar? Where do I get the equipment? What medicines do I need? When should I take them? Do I need to meet with an educator? Who can I call if I have questions? Where can I find a support group? General instructions Take medicines only as told by your provider. Stay at a healthy weight. Drink enough fluid to keep your pee (urine) pale yellow. Check your pee for ketones when sick and as told. Ketones in your pee is a sign that your body is using fat for energy because it's not making enough insulin. Wear an alert bracelet or carry a card that shows you have this condition. Keep all follow-up visits. Your provider needs to check your health and your baby's growth. Where to find more information Gestational Diabetes: American Diabetes Association (ADA): diabetes.org Gestational Diabetes and Pregnancy: Centers for Disease Control and Prevention (CDC): TonerPromos.no American Pregnancy Association: americanpregnancy.org U.S.D.A MyPlate: WrestlingReporter.dk Contact a health care provider if:  Your blood sugar is above your target for two tests in a row. You have a high blood sugar level and you also have ketones in your pee. You have been sick or have had a fever for 2 days or more and aren't getting better. You have any of these problems for more than 6 hours: You can't eat or drink. You vomit or feel like you may vomit. You have watery poop (diarrhea). Get help right away if:  You become confused or cannot think clearly. You have trouble breathing. You have chest pain. Your baby is moving less than usual. You have unusual discharge or bleeding from your vagina. You have cramping in your belly or have pain in your hips or lower back. You have symptoms of high blood pressure or preeclampsia. These include: A severe,  throbbing headache that doesn't go away. Sudden or extreme swelling of your face, hands, legs, or feet. Vision problems, such as: Seeing spots. Blurry vision. Sensitivity to light. These symptoms may be an emergency. Get help right away. Call 911. Do not wait to see if the symptoms will go away. Do not drive yourself to the hospital. This information is not intended to replace advice given to you by your health care provider. Make sure you discuss any questions you have with your health care provider. Document Revised: 12/20/2022 Document Reviewed: 06/29/2022 Elsevier Patient Education  2024 Elsevier Inc. Second Trimester of Pregnancy  The second trimester of pregnancy is from week 14 through week 27. This is months 4 through 6 of pregnancy. During the second trimester: Morning sickness is less or has stopped. You may have more energy. You may feel hungry more often. At this time, your unborn baby is growing very fast. At the end of the sixth month, the unborn baby may be up to 12 inches long and weigh about 1 pounds. You will likely start to feel the baby move between 16 and 20 weeks of pregnancy. Body changes during your second trimester Your body continues to change during this time. The changes usually go away after your baby is born. Physical changes You will gain more weight. Your belly will get bigger. You may begin to get stretch marks on your hips, belly, and breasts. Your breasts will keep growing and may hurt. You may get dark spots or blotches on your face. A dark line from your belly button to the pubic area may appear. This line is called linea nigra. Your hair may grow faster and get thicker. Health changes You may have headaches. You may have heartburn. You may pee more often. You may have swollen, bulging veins (varicose veins). You may have trouble pooping (constipation), or swollen veins in the butt that can itch or get painful (hemorrhoids). You may have back  pain. This is caused by: Weight gain. Pregnancy hormones that are relaxing the joints in your pelvis. Follow these instructions at home: Medicines Talk to your health care provider if you're taking medicines. Ask if the medicines are safe to take during pregnancy. Your provider may change the medicines that you take. Do not take any medicines unless told to by your provider. Take a prenatal vitamin that has at least 600 micrograms (mcg) of folic acid. Do not use herbal medicines, illegal drugs, or medicines that are not approved by your provider. Eating and drinking While you're pregnant your body needs extra food for your growing baby. Talk with your provider about what to eat while pregnant. Activity Most women are able to exercise during pregnancy. Exercises may need to change as your pregnancy goes on. Talk to your provider about your activities and exercise routines. Relieving pain and discomfort Wear a good, supportive bra if your breasts hurt. Rest with your legs raised if you have leg cramps or low back pain. Take warm sitz baths to soothe pain from hemorrhoids. Use hemorrhoid cream if your provider says it's okay. Do not douche. Do not use tampons or scented pads. Do not  use hot tubs, steam rooms, or saunas. Safety Wear your seatbelt at all times when you're in a car. Talk to your provider if someone hits you, hurts you, or yells at you. Talk with your provider if you're feeling sad or have thoughts of hurting yourself. Lifestyle Certain things can be harmful while you're pregnant. It's best to avoid the following: Do not drink alcohol,smoke, vape, or use products with nicotine  or tobacco in them. If you need help quitting, talk with your provider. Avoid cat litter boxes and soil used by cats. These things carry germs that can cause harm to your pregnancy and your baby. General instructions Keep all follow-up visits. It helps you and your unborn baby stay as healthy as  possible. Write down your questions. Take them to your prenatal visits. Your provider will: Talk with you about your overall health. Give you advice or refer you to specialists who can help with different needs, including: Prenatal education classes. Mental health and counseling. Foods and healthy eating. Ask for help if you need help with food. Where to find more information American Pregnancy Association: americanpregnancy.org [employer] of [employer]etricians and Gynecologists: acog.org Office on [employer] Health: TravelLesson.ca Contact a health care provider if: You have a headache that does not go away when you take medicine. You have any of these problems: You can't eat or drink. You throw up or feel like you may throw up. You have watery poop (diarrhea) for 2 days or more. You have pain when you pee or your pee smells bad. You have been sick for 2 days or more and are not getting better. Contact your provider right away if: You have any of these coming from your vagina: Abnormal discharge. Bad-smelling fluid. Bleeding. Your baby is moving less than usual. You have contractions, belly cramping, or have pain in your pelvis or lower back. You have symptoms of high blood pressure or preeclampsia. These include: A severe, throbbing headache that does not go away. Sudden or extreme swelling of your face, hands, legs, or feet. Vision problems: You see spots. You have blurry vision. Your eyes are sensitive to light. If you can't reach the provider, go to an urgent care or emergency room. Get help right away if: You faint, become confused, or can't think clearly. You have chest pain or trouble breathing. You have any kind of injury, such as from a fall or a car crash. These symptoms may be an emergency. Call 911 right away. Do not wait to see if the symptoms will go away. Do not drive yourself to the hospital. This information is not intended to replace advice given to you by  your health care provider. Make sure you discuss any questions you have with your health care provider. Document Revised: 12/20/2022 Document Reviewed: 07/20/2022 Elsevier Patient Education  2024 Elsevier Inc. Early Labor (Pre-Term Labor): What to Know Pregnancy normally lasts 39-41 weeks. Pre-term labor is when labor starts before you have been pregnant for 37 weeks. Babies who are born too early may be at an increased risk for long-term problems like cerebral palsy, developmental delays, and vision and hearing problems. Premature babies may also have problems soon after birth, such as problems with their blood sugar, body temperature, heart, and breathing. These babies often have trouble with feeding. These problems may be very serious in babies who are born before 34 weeks of pregnancy. What are the causes? The cause of pre-term labor is not known. What increases the risk? You're more likely to  have pre-term labor if: You have medical problems, now or in the past. You have problems now or in your past pregnancies. You have risk factors related to lifestyle, environment, and age. Medical history You have problems affecting your uterus, including a short cervix. You have a sexually transmitted infection (STI) or other infections of the urinary tract and the vagina. You have: Blood clotting problems. High blood pressure. High blood sugar. You have a low body weight or too much body weight. Present and past pregnancies You have had pre-term labor before. You're pregnant with more than one baby. The placenta covers your cervix,. This is called placenta previa. Your unborn baby has a congenital condition. This means your baby will be born with the condition. You have bleeding from your vagina while you're pregnant. You became pregnant through in vitro fertilization (IVF). Lifestyle and environmental factors You use drugs, tobacco products or drink alcohol. You have stress and no social  support. You experience domestic violence. You're exposed to certain chemicals at home or work. Other factors You're younger than age 39 or older than age 32. What are the signs or symptoms?  Cramps like those that can happen during a menstrual period. The cramps may happen with diarrhea, which is watery poop. Pain your belly or lower back. Regular contractions. It may feel like your belly is getting tighter. Pressure in your pelvis. Increased watery or bloody discharge from your vagina. Your water breaking. How is this diagnosed? This condition is diagnosed based on: Your medical history and a physical exam. A pelvic exam. An ultrasound. Monitoring for contractions. Other tests, including: A swab of the cervix to check for a protein substance called fetal fibronectin. This protein is usually present in the area between the uterus and the amniotic sac early in pregnancy and then again towards the end. If it's found in the middle of pregnancy, it can sometimes be a sign of pre-term labor. Urine tests. How is this treated? Treatment depends on how far along your pregnancy is, the health of your baby, and your health. Treatment may include: Taking medicines to: Stop contractions. Help the baby's lungs mature if the risk of early delivery is high. Medicines to help prevent your baby from having cerebral palsy or other problems. Bed rest. If the labor happens before 34 weeks of pregnancy, you may need to stay in the hospital. Delivery of the baby. Follow these instructions at home: Do not smoke, vape, or use nicotine  or tobacco. Do not drink alcohol. Take your medicines only as told. Rest as told by your provider. Ask what things are safe for you to do at home. Ask when you can go back to work or school. Keep all follow-up visits. Your provider will need to closely follow your health and the health of your baby. How is this prevented? To increase your chance of having a full-term  pregnancy: Do not use drugs or take medicines that have not been prescribed to you during your pregnancy. Talk with your provider before taking any herbal supplements, even if you have been taking them regularly. Gain a healthy amount of weight during your pregnancy. Watch for infection. If you think that you might have an infection, get it checked right away. Symptoms of infection may include: Fever. Discharge from your vagina that smells bad or is not normal. Pain or burning when you pee. Having to pee small amounts or very often. Blood in your pee. Where to find more information To learn more, go to  these websites: U.S. Department of Health and Cytogeneticist on Women's Health: http://hoffman.com/ [employer] of [employer]stetricians and Gynecologists: www.acog.org Centers for Disease Control and Prevention at DiningCalendar.de. Then: Click Search and type preterm labor. Find the link you need. Contact a health care provider if: You think you're going into pre-term labor. You have signs or symptoms of pre-term labor. You have symptoms of infection. Get help right away if: You're having regular, painful contractions every 5 minutes or less. Your water breaks. This information is not intended to replace advice given to you by your health care provider. Make sure you discuss any questions you have with your health care provider. Document Revised: 09/27/2022 Document Reviewed: 09/27/2022 Elsevier Patient Education  2024 [employer].

## 2024-02-24 LAB — CERVICOVAGINAL ANCILLARY ONLY
Chlamydia: NEGATIVE
Comment: NEGATIVE
Comment: NEGATIVE
Comment: NORMAL
Neisseria Gonorrhea: NEGATIVE
Trichomonas: NEGATIVE

## 2024-02-25 ENCOUNTER — Ambulatory Visit: Payer: Self-pay | Admitting: Certified Nurse Midwife

## 2024-03-03 ENCOUNTER — Other Ambulatory Visit

## 2024-03-03 ENCOUNTER — Ambulatory Visit

## 2024-03-06 ENCOUNTER — Other Ambulatory Visit

## 2024-03-06 ENCOUNTER — Ambulatory Visit

## 2024-03-13 ENCOUNTER — Other Ambulatory Visit: Payer: Self-pay | Admitting: *Deleted

## 2024-03-13 ENCOUNTER — Ambulatory Visit

## 2024-03-13 ENCOUNTER — Ambulatory Visit: Attending: Obstetrics and Gynecology | Admitting: Obstetrics

## 2024-03-13 DIAGNOSIS — O99212 Obesity complicating pregnancy, second trimester: Secondary | ICD-10-CM | POA: Diagnosis not present

## 2024-03-13 DIAGNOSIS — E669 Obesity, unspecified: Secondary | ICD-10-CM | POA: Diagnosis not present

## 2024-03-13 DIAGNOSIS — Z3A25 25 weeks gestation of pregnancy: Secondary | ICD-10-CM

## 2024-03-13 DIAGNOSIS — Z362 Encounter for other antenatal screening follow-up: Secondary | ICD-10-CM

## 2024-03-13 DIAGNOSIS — O358XX0 Maternal care for other (suspected) fetal abnormality and damage, not applicable or unspecified: Secondary | ICD-10-CM | POA: Diagnosis not present

## 2024-03-13 NOTE — Progress Notes (Signed)
 MFM Consult Note  Ashley Austin is currently at [redacted]w[redacted]d. She was seen today for a detailed fetal anatomy scan due to maternal obesity with a BMI of 52.6.  She denies any problems in her current pregnancy.  Her early screen for gestational diabetes was negative.  She had a cell free DNA test earlier in her pregnancy which indicated a low risk for trisomy 65, 33, and 13. A female fetus is predicted.   Sonographic findings Single intrauterine pregnancy at 25w 1d. Fetal cardiac activity:  Observed and appears normal. Presentation: Variable. The views of the fetal anatomy were limited today due to maternal body habitus.  What was visualized today appeared within normal limits. Fetal biometry shows the estimated fetal weight of 1 lb 9 oz, 711 grams (19%). Amniotic fluid: Within normal limits.  MVP: 5.7 cm. Placenta: Posterior.  The patient was informed that anomalies may be missed due to technical limitations. If the fetus is in a suboptimal position or maternal habitus is increased, visualization of the fetus in the maternal uterus may be impaired.  Obesity in pregnancy  Due to maternal obesity, we will continue to follow her with growth ultrasounds throughout her pregnancy.    She should be rescreened for gestational diabetes at 28 weeks.    Weekly fetal testing should be started at around 34 weeks and continued until delivery.    Delivery should occur at around 39 weeks.  A follow-up growth scan was scheduled in 4 weeks.  We will try to reassess the views of the fetal anatomy at that exam.  The patient stated that all of her questions were answered.   A total of 25 minutes was spent counseling and coordinating the care for this patient.  Greater than 50% of the time was spent in direct face-to-face contact.

## 2024-03-20 ENCOUNTER — Ambulatory Visit

## 2024-03-20 ENCOUNTER — Other Ambulatory Visit

## 2024-03-20 NOTE — Progress Notes (Signed)
" ° ° °  Return Prenatal Note   Subjective   28 y.o. G3P2002 at [redacted]w[redacted]d presents for this follow-up prenatal visit.  Patient is struggling with constipation for the last couple of weeks.  She takes Miralax every day but it doesn't always help.  Reports hx of constipation outside of pregnancy & usually Miralax helps. Patient reports: Movement: Present Contractions: Not present  Objective   Flow sheet Vitals: Pulse Rate: 88 BP: (!) 105/58 Fundal Height: 29 cm Fetal Heart Rate (bpm): 135 Total weight gain: 13 lb 8 oz (6.124 kg)  General Appearance  No acute distress, well appearing, and well nourished Pulmonary   Normal work of breathing Neurologic   Alert and oriented to person, place, and time Psychiatric   Mood and affect within normal limits   Assessment/Plan   Plan  28 y.o. [id number] at [redacted]w[redacted]d presents for follow-up OB visit. Reviewed prenatal record including previous visit note.  Supervision of high-risk pregnancy Reviewed kick counts and preterm labor warning signs. Instructed to call office or come to hospital with persistent headache, vision changes, regular contractions, leaking of fluid, decreased fetal movement or vaginal bleeding. 28w labs, TDAP today. Plans epidural in labor. Bottle feeding. IUD for contraception.  Obesity affecting pregnancy Next MFM for growth 1/16. Start NSTs at 34w. Delivery by EDD. Phone number to call & schedule visit with anesthesia provided. Referral is active.  Constipation during pregnancy May continue daily miralax, add magnesium  citrate. Consider colace in addition.       Orders Placed This Encounter  Procedures   Tdap vaccine greater than or equal to 7yo IM   No follow-ups on file.   Future Appointments  Date Time Provider Department Center  04/17/2024  2:15 PM [facility] PROVIDER 1 [facility]  04/17/2024  2:30 PM [facility] US1 [facility]    For next visit:  continue with routine prenatal care     Harlene LITTIE Cisco, CNM   12/26/20259:55 AM  "

## 2024-03-27 ENCOUNTER — Ambulatory Visit (INDEPENDENT_AMBULATORY_CARE_PROVIDER_SITE_OTHER): Admitting: Certified Nurse Midwife

## 2024-03-27 ENCOUNTER — Other Ambulatory Visit

## 2024-03-27 VITALS — BP 105/58 | HR 88 | Wt 288.5 lb

## 2024-03-27 DIAGNOSIS — O99619 Diseases of the digestive system complicating pregnancy, unspecified trimester: Secondary | ICD-10-CM | POA: Diagnosis not present

## 2024-03-27 DIAGNOSIS — Z131 Encounter for screening for diabetes mellitus: Secondary | ICD-10-CM

## 2024-03-27 DIAGNOSIS — Z3A27 27 weeks gestation of pregnancy: Secondary | ICD-10-CM | POA: Diagnosis not present

## 2024-03-27 DIAGNOSIS — Z369 Encounter for antenatal screening, unspecified: Secondary | ICD-10-CM

## 2024-03-27 DIAGNOSIS — O0992 Supervision of high risk pregnancy, unspecified, second trimester: Secondary | ICD-10-CM

## 2024-03-27 DIAGNOSIS — O99210 Obesity complicating pregnancy, unspecified trimester: Secondary | ICD-10-CM

## 2024-03-27 DIAGNOSIS — Z114 Encounter for screening for human immunodeficiency virus [HIV]: Secondary | ICD-10-CM

## 2024-03-27 DIAGNOSIS — Z23 Encounter for immunization: Secondary | ICD-10-CM

## 2024-03-27 DIAGNOSIS — K5900 Constipation, unspecified: Secondary | ICD-10-CM | POA: Insufficient documentation

## 2024-03-27 NOTE — Assessment & Plan Note (Signed)
 Next MFM for growth 1/16. Start NSTs at 34w. Delivery by EDD. Phone number to call & schedule visit with anesthesia provided. Referral is active.

## 2024-03-27 NOTE — Assessment & Plan Note (Signed)
 Reviewed kick counts and preterm labor warning signs. Instructed to call office or come to hospital with persistent headache, vision changes, regular contractions, leaking of fluid, decreased fetal movement or vaginal bleeding. 28w labs, TDAP today. Plans epidural in labor. Bottle feeding. IUD for contraception.

## 2024-03-27 NOTE — Assessment & Plan Note (Signed)
 May continue daily miralax, add magnesium  citrate. Consider colace in addition.

## 2024-03-28 LAB — 28 WEEK RH+PANEL
Basophils Absolute: 0 x10E3/uL (ref 0.0–0.2)
Basos: 0 %
EOS (ABSOLUTE): 0.1 x10E3/uL (ref 0.0–0.4)
Eos: 1 %
Gestational Diabetes Screen: 86 mg/dL (ref 70–139)
HIV Screen 4th Generation wRfx: NONREACTIVE
Hematocrit: 35.4 % (ref 34.0–46.6)
Hemoglobin: 10.7 g/dL — ABNORMAL LOW (ref 11.1–15.9)
Immature Grans (Abs): 0.1 x10E3/uL (ref 0.0–0.1)
Immature Granulocytes: 1 %
Lymphocytes Absolute: 1.1 x10E3/uL (ref 0.7–3.1)
Lymphs: 11 %
MCH: 23.5 pg — ABNORMAL LOW (ref 26.6–33.0)
MCHC: 30.2 g/dL — ABNORMAL LOW (ref 31.5–35.7)
MCV: 78 fL — ABNORMAL LOW (ref 79–97)
Monocytes Absolute: 0.5 x10E3/uL (ref 0.1–0.9)
Monocytes: 5 %
Neutrophils Absolute: 8 x10E3/uL — ABNORMAL HIGH (ref 1.4–7.0)
Neutrophils: 82 %
Platelets: 269 x10E3/uL (ref 150–450)
RBC: 4.56 x10E6/uL (ref 3.77–5.28)
RDW: 15.1 % (ref 11.7–15.4)
RPR Ser Ql: NONREACTIVE
WBC: 9.7 x10E3/uL (ref 3.4–10.8)

## 2024-04-10 ENCOUNTER — Ambulatory Visit (INDEPENDENT_AMBULATORY_CARE_PROVIDER_SITE_OTHER): Admitting: Obstetrics

## 2024-04-10 ENCOUNTER — Encounter
Admission: RE | Admit: 2024-04-10 | Discharge: 2024-04-10 | Disposition: A | Discharge status: Home / Self Care | Source: Ambulatory Visit | Attending: Anesthesiology | Admitting: Anesthesiology

## 2024-04-10 VITALS — BP 109/77 | HR 80 | Wt 290.0 lb

## 2024-04-10 DIAGNOSIS — Z3A29 29 weeks gestation of pregnancy: Secondary | ICD-10-CM

## 2024-04-10 DIAGNOSIS — O0993 Supervision of high risk pregnancy, unspecified, third trimester: Secondary | ICD-10-CM

## 2024-04-10 NOTE — Assessment & Plan Note (Addendum)
-  Mood is stable. -Anticipatory guidance about 3rd trimester -Reviewed normal glucose results. Discussed sources of dietary iron.  -Reviewed kick counts and preterm labor warning signs. Instructed to call office or come to hospital with persistent headache, vision changes, regular contractions, leaking of fluid, decreased fetal movement or vaginal bleeding.

## 2024-04-10 NOTE — Assessment & Plan Note (Signed)
-  Taking ASA daily -Growth scan with MFM 04/17/24 -Discussed NSTs starting at 34 weeks

## 2024-04-10 NOTE — Progress Notes (Signed)
" ° ° °  Return Prenatal Note   Assessment/Plan   Plan  29 y.o. G3P2002 at [redacted]w[redacted]d presents for follow-up OB visit. Reviewed prenatal record including previous visit note.  Obesity affecting pregnancy -Taking ASA daily -Growth scan with MFM 04/17/24 -Discussed NSTs starting at 34 weeks  Supervision of high-risk pregnancy -Mood is stable. -Anticipatory guidance about 3rd trimester -Reviewed normal glucose results. Discussed sources of dietary iron.  -Reviewed kick counts and preterm labor warning signs. Instructed to call office or come to hospital with persistent headache, vision changes, regular contractions, leaking of fluid, decreased fetal movement or vaginal bleeding.     No orders of the defined types were placed in this encounter.  Return in about 2 weeks (around 04/24/2024).   Future Appointments  Date Time Provider Department Center  04/10/2024 10:00 AM [facility] None  04/17/2024  2:15 PM [facility] PROVIDER 1 [facility]  04/17/2024  2:30 PM [facility] US1 [facility]  04/24/2024  8:15 AM [name], CNM [facility] None    For next visit:  Routine prenatal care    Subjective   Ashley Austin is feeling well. Baby is moving a lot.  Movement: Present Contractions: Not present  Objective   Flow sheet Vitals: Pulse Rate: 80 BP: 109/77 Fundal Height: 30 cm Fetal Heart Rate (bpm): 128 Total weight gain: 15 lb (6.804 kg)  General Appearance  No acute distress, well appearing, and well nourished Pulmonary   Normal work of breathing Neurologic   Alert and oriented to person, place, and time Psychiatric   Mood and affect within normal limits  Eleanor Canny, CNM 04/10/2024 9:20 AM  "

## 2024-04-17 ENCOUNTER — Ambulatory Visit

## 2024-04-22 ENCOUNTER — Ambulatory Visit: Admitting: Physician Assistant

## 2024-04-22 ENCOUNTER — Encounter: Payer: Self-pay | Admitting: Physician Assistant

## 2024-04-22 VITALS — BP 126/84 | HR 85 | Temp 97.5°F | Ht 61.0 in | Wt 289.0 lb

## 2024-04-22 DIAGNOSIS — J019 Acute sinusitis, unspecified: Secondary | ICD-10-CM | POA: Diagnosis not present

## 2024-04-22 MED ORDER — AMOXICILLIN 875 MG PO TABS: 875.0000 mg | ORAL_TABLET | Freq: Two times a day (BID) | ORAL | 0 refills | Status: DC

## 2024-04-22 NOTE — Progress Notes (Signed)
 "   Date:  04/22/2024   Name:  Ashley Austin   DOB:  1996-02-19   MRN:  [id number]   Chief Complaint: Sinus Problem (X1 week, has felt better but started feeling pain in sinuses)  Sinus Problem This is a new problem. Episode onset: X 1 week. The problem is unchanged. There has been no fever. Her pain is at a severity of 5/10. The pain is mild. Associated symptoms include congestion, coughing, ear pain, headaches, sinus pressure and sneezing. Past treatments include saline nose sprays. The treatment provided no relief.   Emy presents today with 10 days URI symptoms, pregnant, mainly concerned with sinus pressure and pain.  No fever.   Medication list has been reviewed and updated.  Active Medications[1]   Review of Systems  HENT:  Positive for congestion, ear pain, sinus pressure and sneezing.   Respiratory:  Positive for cough.   Neurological:  Positive for headaches.    Patient Active Problem List   Diagnosis Date Noted   Constipation during pregnancy 03/27/2024   Obesity affecting pregnancy 01/10/2024   Supervision of high-risk pregnancy 10/25/2023   History of postpartum hypertension 10/25/2023   Current episode of major depressive disorder without prior episode 10/27/2021    Allergies[2]  Immunization History  Administered Date(s) Administered   Influenza Inj Mdck Quad Pf 06/16/2021   Influenza, Seasonal, Injecte, Preservative Fre 12/13/2023   Influenza,inj,Quad PF,6+ Mos 06/16/2019, 12/29/2019   Tdap 05/10/2020   Varicella 07/17/2020    Past Surgical History:  Procedure Laterality Date   NO PAST SURGERIES      Social History[3]  Family History  Problem Relation Age of Onset   Hypertension Mother    Varicose Veins Mother    Diabetes Maternal Grandmother    Anxiety disorder Sister    Breast cancer Neg Hx    Ovarian cancer Neg Hx         04/22/2024    3:55 PM 05/29/2023   11:11 AM 11/28/2022    2:56 PM 11/26/2022    3:53 PM  GAD 7 : Generalized  Anxiety Score  Nervous, Anxious, on Edge 0 0  1  1   Control/stop worrying 0 0  1  0   Worry too much - different things 0 0  1  0   Trouble relaxing 0 0  0  1   Restless 0 0  0  0   Easily annoyed or irritable 0 0  1  2   Afraid - awful might happen 0 0  0  0   Total GAD 7 Score 0 0 4 4  Anxiety Difficulty Not difficult at all Not difficult at all Not difficult at all Not difficult at all     Data saved with a previous flowsheet row definition       04/22/2024    3:55 PM 05/29/2023   11:11 AM 11/28/2022    2:56 PM  Depression screen PHQ 2/9  Decreased Interest 0 0 0  Down, Depressed, Hopeless 0 0 1  PHQ - 2 Score 0 0 1  Altered sleeping 0 0 0  Tired, decreased energy 0 0 1  Change in appetite 0 0 0  Feeling bad or failure about yourself  0 0 0  Trouble concentrating 0 0 0  Moving slowly or fidgety/restless 0 0 0  Suicidal thoughts 0 0 0  PHQ-9 Score 0 0  2   Difficult doing work/chores Not difficult at all Not difficult at all Not difficult  at all     Data saved with a previous flowsheet row definition    BP Readings from Last 3 Encounters:  04/22/24 126/84  04/10/24 109/77  03/27/24 (!) 105/58    Wt Readings from Last 3 Encounters:  04/22/24 289 lb (131.1 kg)  04/10/24 290 lb (131.5 kg)  03/27/24 288 lb 8 oz (130.9 kg)    BP 126/84   Pulse 85   Temp (!) 97.5 F (36.4 C)   Ht 5' 1 (1.549 m)   Wt 289 lb (131.1 kg)   LMP 09/07/2023 (Exact Date)   SpO2 97%   BMI 54.61 kg/m   Physical Exam Vitals and nursing note reviewed.  Constitutional:      General: She is not in acute distress.    Appearance: Normal appearance.  HENT:     Right Ear: Tympanic membrane normal.     Left Ear: Tympanic membrane normal.     Ears:     Comments: EAC clear bilaterally with good view of TM which is without effusion or erythema.     Nose: Congestion present.     Comments: Sinuses nontender    Mouth/Throat:     Mouth: Mucous membranes are moist.     Pharynx: No  oropharyngeal exudate or posterior oropharyngeal erythema.  Eyes:     Conjunctiva/sclera: Conjunctivae normal.     Pupils: Pupils are equal, round, and reactive to light.  Cardiovascular:     Rate and Rhythm: Normal rate and regular rhythm.     Heart sounds: No murmur heard.    No friction rub. No gallop.  Pulmonary:     Effort: Pulmonary effort is normal.     Breath sounds: Normal breath sounds. No wheezing, rhonchi or rales.  Abdominal:     General: There is no distension.  Musculoskeletal:        General: Normal range of motion.  Lymphadenopathy:     Cervical: No cervical adenopathy.  Skin:    General: Skin is warm and dry.  Neurological:     Mental Status: She is alert and oriented to person, place, and time.     Gait: Gait is intact.  Psychiatric:        Mood and Affect: Mood and affect normal.     Recent Labs     Component Value Date/Time   NA 133 (L) 12/13/2023 1400   NA 137 03/09/2013 1234   K 3.9 12/13/2023 1400   K 4.0 03/09/2013 1234   CL 99 12/13/2023 1400   CL 108 (H) 03/09/2013 1234   CO2 21 12/13/2023 1400   CO2 21 03/09/2013 1234   GLUCOSE 77 12/13/2023 1400   GLUCOSE 84 11/19/2022 1807   GLUCOSE 76 03/09/2013 1234   BUN 8 12/13/2023 1400   BUN 9 03/09/2013 1234   CREATININE 0.45 (L) 12/13/2023 1400   CREATININE 0.51 (L) 03/09/2013 1234   CALCIUM  9.3 12/13/2023 1400   CALCIUM  9.1 03/09/2013 1234   PROT 7.0 12/13/2023 1400   ALBUMIN 4.1 12/13/2023 1400   AST 9 12/13/2023 1400   AST 28 (H) 03/09/2013 1234   ALT 11 12/13/2023 1400   ALKPHOS 73 12/13/2023 1400   BILITOT 0.3 12/13/2023 1400   GFRNONAA >60 11/19/2022 1807   GFRNONAA >60 03/09/2013 1234   GFRAA >60 05/06/2019 2331   GFRAA >60 03/09/2013 1234    Lab Results  Component Value Date   WBC 9.7 03/27/2024   HGB 10.7 (L) 03/27/2024   HCT 35.4 03/27/2024  MCV 78 (L) 03/27/2024   PLT 269 03/27/2024   Lab Results  Component Value Date   HGBA1C 5.6 12/13/2023   HGBA1C 5.7 10/03/2011    Lab Results  Component Value Date   CHOL 154 10/03/2011   HDL 55 10/03/2011   LDLCALC 84 10/03/2011   TRIG 75 10/03/2011   Lab Results  Component Value Date   TSH 1.370 12/13/2023        Assessment & Plan Acute non-recurrent sinusitis, unspecified location Relatively benign exam today.  Probably viral etiology, reviewed with patient that would be best to avoid antibiotics if at all possible especially during pregnancy.  However, she has been struggling with symptoms for 10 days so would be reasonable to treat for bacterial sinusitis.  Ultimately, I have sent a prescription for amoxicillin  to pharmacy but advised her to wait until Friday around lunchtime to assess whether or not she is improving without it.  If she is either no better or she is worse, she will pick up her prescription on Friday. Orders:   amoxicillin  (AMOXIL ) 875 MG tablet; Take 1 tablet (875 mg total) by mouth 2 (two) times daily for 7 days.       Rolan Hoyle, PA-C, DMSc, DipACLM, Nutritionist [facility] [phone]      [1]  Current Meds  Medication Sig   amoxicillin  (AMOXIL ) 875 MG tablet Take 1 tablet (875 mg total) by mouth 2 (two) times daily for 7 days.   aspirin  81 MG chewable tablet Chew 2 tablets (162 mg total) by mouth daily.   [DISCONTINUED] PARoxetine  (PAXIL ) 30 MG tablet TAKE 1 TABLET BY MOUTH EVERY DAY  [2] No Known Allergies [3]  Social History Tobacco Use   Smoking status: Never   Smokeless tobacco: Never  Vaping Use   Vaping status: Never Used  Substance Use Topics   Alcohol use: Not Currently   Drug use: Never   "

## 2024-04-24 ENCOUNTER — Ambulatory Visit (INDEPENDENT_AMBULATORY_CARE_PROVIDER_SITE_OTHER): Admitting: Certified Nurse Midwife

## 2024-04-24 VITALS — BP 127/73 | HR 89 | Wt 289.3 lb

## 2024-04-24 DIAGNOSIS — O0993 Supervision of high risk pregnancy, unspecified, third trimester: Secondary | ICD-10-CM

## 2024-04-24 DIAGNOSIS — Z3A31 31 weeks gestation of pregnancy: Secondary | ICD-10-CM

## 2024-04-24 NOTE — Progress Notes (Signed)
 ROB doing well. Feeling good fetal movement. Fundal height difficult to assess due to material body habitus. Reviewed kick counts, pre E signs and symptoms. She denies. Discussed anethesia consult. Pt state she had done on 1/9 no note seen. CMA to call and request note be placed. Pt states that anesthesia cleared for delivery unless BMI exceeds 60.  Discussed next u/s with MFM 1/30 and start of NST at 34 weeks.   Follow up 2 weeks.   Zelda Hummer, CNM

## 2024-04-24 NOTE — Patient Instructions (Signed)
 Fetal Movement Counts When you're pregnant, you might start feeling your baby move around the middle of your pregnancy. At first, these movements might feel like flutters, rolls, or swishes. As your baby grows, you might feel more kicks and jabs. Around week 28 of your pregnancy, your health care team may ask you to count how often your baby moves. This is important for all pregnancies, but especially for high-risk ones. Counting movements can help lessen the risk of stillbirth. What is a fetal movement count? A fetal movement count is the number of times that you feel your baby move during a certain amount of time. This may also be called a kick count. There are many ways to do a kick count. Ask your team what is best for you. Pay attention to when your baby is most active. You may notice your baby's sleep and wake cycles. You may also notice things that make your baby move more. When you do a kick count, try to do it: When your baby is normally most active. At the same time each day. How do I count fetal movements?  Find a quiet, comfortable area. Sit or lie down. Write down the date, the start time, and the number of movements you feel. Count kicks, flutters, swishes, rolls, and jabs. Usually, you will feel at least 10 movements within 2 hours. Stop counting after you have felt 10 movements or if you have been counting for 2 hours. Write down the stop time. Contact a health care provider if: You don't feel 10 movements in 2 hours. Your baby isn't moving as it usually does. Your baby isn't moving at all. If you're not able to reach your provider, go to an emergency room. This information is not intended to replace advice given to you by your health care provider. Make sure you discuss any questions you have with your health care provider. Document Revised: 04/12/2023 Document Reviewed: 04/04/2022 Elsevier Patient Education  2025 [employer].

## 2024-05-01 ENCOUNTER — Ambulatory Visit (HOSPITAL_BASED_OUTPATIENT_CLINIC_OR_DEPARTMENT_OTHER)

## 2024-05-01 ENCOUNTER — Ambulatory Visit: Attending: Obstetrics and Gynecology | Admitting: Obstetrics and Gynecology

## 2024-05-01 VITALS — BP 116/49 | HR 81

## 2024-05-01 DIAGNOSIS — O99213 Obesity complicating pregnancy, third trimester: Secondary | ICD-10-CM | POA: Diagnosis present

## 2024-05-01 DIAGNOSIS — Z364 Encounter for antenatal screening for fetal growth retardation: Secondary | ICD-10-CM | POA: Diagnosis not present

## 2024-05-01 DIAGNOSIS — E669 Obesity, unspecified: Secondary | ICD-10-CM | POA: Diagnosis not present

## 2024-05-01 DIAGNOSIS — O0993 Supervision of high risk pregnancy, unspecified, third trimester: Secondary | ICD-10-CM | POA: Diagnosis not present

## 2024-05-01 DIAGNOSIS — Z3A32 32 weeks gestation of pregnancy: Secondary | ICD-10-CM

## 2024-05-01 DIAGNOSIS — O99212 Obesity complicating pregnancy, second trimester: Secondary | ICD-10-CM | POA: Diagnosis not present

## 2024-05-01 DIAGNOSIS — K5900 Constipation, unspecified: Secondary | ICD-10-CM

## 2024-05-01 DIAGNOSIS — O358XX0 Maternal care for other (suspected) fetal abnormality and damage, not applicable or unspecified: Secondary | ICD-10-CM | POA: Diagnosis not present

## 2024-05-01 DIAGNOSIS — Z362 Encounter for other antenatal screening follow-up: Secondary | ICD-10-CM | POA: Diagnosis not present

## 2024-05-01 NOTE — Progress Notes (Signed)
 After review, MFM consult with provider is not indicated for today  Arna Ranks, MD 05/01/2024 9:25 AM  [facility]

## 2024-05-07 NOTE — Progress Notes (Unsigned)
" ° ° °  Return Prenatal Note   Assessment/Plan   Plan  29 y.o. G3P2002 at [redacted]w[redacted]d presents for follow-up OB visit. Reviewed prenatal record including previous visit note.  Supervision of high-risk pregnancy -Discussed planning for birth -Reviewed kick counts and preterm labor warning signs. Instructed to call office or come to hospital with persistent headache, vision changes, regular contractions, leaking of fluid, decreased fetal movement or vaginal bleeding.     Obesity affecting pregnancy -Needs monthly growth scans. On waiting list for next MFM US . [facility] generally only has availability on Fridays, but states if she absolutely has to come on a different day she can. -Weekly NSTs starting at 34w    No orders of the defined types were placed in this encounter.  No follow-ups on file.   Future Appointments  Date Time Provider Department Center  05/15/2024  8:15 AM [facility] ROOM [facility] None  05/22/2024  8:15 AM [facility] ROOM [facility] None  05/22/2024  8:55 AM [name], CNM [facility] None  05/29/2024  8:15 AM [facility]ST ROOM [facility] None  05/29/2024  8:55 AM Jayne Raisin L, CNM [facility] None  06/05/2024  8:15 AM [facility]OB-NST ROOM [facility] None  06/05/2024  8:55 AM Dominic, Jinnie Jansky, CNM [facility] None  06/12/2024  8:15 AM [facility]OB-NST ROOM [facility] None  06/12/2024  8:55 AM Slaughterbeck, Damien, CNM [facility] None  06/19/2024  8:15 AM [facility]OB-NST ROOM [facility] None  06/19/2024  8:55 AM Slaughterbeck, Damien, CNM [facility] None    For next visit:  ROB with NST    Subjective  Nhu is feeling well. No concerns today.  Movement: Present Contractions: Not present  Objective   Flow sheet Vitals: Pulse Rate: 86 BP: 116/76 Fundal Height: 33 cm Fetal Heart Rate (bpm): 138 Total weight gain: 14 lb (6.35 kg)  General Appearance  No acute distress, well appearing, and well nourished Pulmonary   Normal work of breathing Neurologic   Alert and oriented to person, place, and time Psychiatric   Mood and  affect within normal limits  Eleanor Canny, CNM 05/08/24 2:56 PM  "

## 2024-05-08 ENCOUNTER — Ambulatory Visit: Admitting: Obstetrics

## 2024-05-08 VITALS — BP 116/76 | HR 86 | Wt 289.0 lb

## 2024-05-08 DIAGNOSIS — Z3A33 33 weeks gestation of pregnancy: Secondary | ICD-10-CM

## 2024-05-08 DIAGNOSIS — Z3483 Encounter for supervision of other normal pregnancy, third trimester: Secondary | ICD-10-CM

## 2024-05-08 NOTE — Assessment & Plan Note (Addendum)
-  Discussed planning for birth -Reviewed kick counts and preterm labor warning signs. Instructed to call office or come to hospital with persistent headache, vision changes, regular contractions, leaking of fluid, decreased fetal movement or vaginal bleeding.

## 2024-05-08 NOTE — Assessment & Plan Note (Addendum)
-  Needs monthly growth scans. On waiting list for next MFM US . Ashley Austin generally only has availability on Fridays, but states if she absolutely has to come on a different day she can. -Weekly NSTs starting at 34w

## 2024-05-15 ENCOUNTER — Other Ambulatory Visit

## 2024-05-22 ENCOUNTER — Other Ambulatory Visit

## 2024-05-22 ENCOUNTER — Encounter: Admitting: Certified Nurse Midwife

## 2024-05-29 ENCOUNTER — Other Ambulatory Visit

## 2024-05-29 ENCOUNTER — Encounter: Admitting: Certified Nurse Midwife

## 2024-06-05 ENCOUNTER — Other Ambulatory Visit

## 2024-06-05 ENCOUNTER — Encounter: Admitting: Licensed Practical Nurse

## 2024-06-12 ENCOUNTER — Other Ambulatory Visit

## 2024-06-12 ENCOUNTER — Encounter: Admitting: Certified Nurse Midwife

## 2024-06-19 ENCOUNTER — Encounter: Admitting: Certified Nurse Midwife

## 2024-06-19 ENCOUNTER — Other Ambulatory Visit
